# Patient Record
Sex: Female | Born: 1935 | Race: White | Hispanic: No | Marital: Married | Smoking: Never smoker
Health system: Southern US, Community
[De-identification: ages and names within clinical notes are randomized; demographics above are authoritative.]

## PROBLEM LIST (undated history)

## (undated) DIAGNOSIS — F0390 Unspecified dementia without behavioral disturbance: Secondary | ICD-10-CM

## (undated) DIAGNOSIS — S065XAA Traumatic subdural hemorrhage with loss of consciousness status unknown, initial encounter: Secondary | ICD-10-CM

## (undated) DIAGNOSIS — I4891 Unspecified atrial fibrillation: Secondary | ICD-10-CM

## (undated) DIAGNOSIS — I10 Essential (primary) hypertension: Secondary | ICD-10-CM

## (undated) DIAGNOSIS — K219 Gastro-esophageal reflux disease without esophagitis: Secondary | ICD-10-CM

## (undated) DIAGNOSIS — S72001A Fracture of unspecified part of neck of right femur, initial encounter for closed fracture: Secondary | ICD-10-CM

## (undated) DIAGNOSIS — S065X9A Traumatic subdural hemorrhage with loss of consciousness of unspecified duration, initial encounter: Secondary | ICD-10-CM

## (undated) HISTORY — PX: ABDOMINAL HYSTERECTOMY: SHX81

## (undated) HISTORY — PX: HIP SURGERY: SHX245

---

## 2015-07-12 ENCOUNTER — Inpatient Hospital Stay (HOSPITAL_COMMUNITY)
Admission: AD | Admit: 2015-07-12 | Discharge: 2015-07-15 | DRG: 065 | Disposition: A | Discharge status: Skilled Nursing Facility | Payer: Medicare Other | Source: Other Acute Inpatient Hospital | Attending: Internal Medicine | Admitting: Internal Medicine

## 2015-07-12 ENCOUNTER — Encounter (HOSPITAL_COMMUNITY): Payer: Self-pay | Admitting: Internal Medicine

## 2015-07-12 DIAGNOSIS — Z9181 History of falling: Secondary | ICD-10-CM | POA: Diagnosis not present

## 2015-07-12 DIAGNOSIS — Z823 Family history of stroke: Secondary | ICD-10-CM

## 2015-07-12 DIAGNOSIS — F0390 Unspecified dementia without behavioral disturbance: Secondary | ICD-10-CM

## 2015-07-12 DIAGNOSIS — E785 Hyperlipidemia, unspecified: Secondary | ICD-10-CM | POA: Diagnosis not present

## 2015-07-12 DIAGNOSIS — I6340 Cerebral infarction due to embolism of unspecified cerebral artery: Secondary | ICD-10-CM | POA: Diagnosis not present

## 2015-07-12 DIAGNOSIS — Z885 Allergy status to narcotic agent status: Secondary | ICD-10-CM

## 2015-07-12 DIAGNOSIS — K219 Gastro-esophageal reflux disease without esophagitis: Secondary | ICD-10-CM | POA: Insufficient documentation

## 2015-07-12 DIAGNOSIS — Z66 Do not resuscitate: Secondary | ICD-10-CM | POA: Diagnosis present

## 2015-07-12 DIAGNOSIS — Z8249 Family history of ischemic heart disease and other diseases of the circulatory system: Secondary | ICD-10-CM

## 2015-07-12 DIAGNOSIS — G8191 Hemiplegia, unspecified affecting right dominant side: Secondary | ICD-10-CM | POA: Diagnosis not present

## 2015-07-12 DIAGNOSIS — R4701 Aphasia: Secondary | ICD-10-CM | POA: Diagnosis not present

## 2015-07-12 DIAGNOSIS — I69392 Facial weakness following cerebral infarction: Secondary | ICD-10-CM | POA: Diagnosis not present

## 2015-07-12 DIAGNOSIS — I4891 Unspecified atrial fibrillation: Secondary | ICD-10-CM | POA: Diagnosis not present

## 2015-07-12 DIAGNOSIS — I10 Essential (primary) hypertension: Secondary | ICD-10-CM

## 2015-07-12 DIAGNOSIS — I639 Cerebral infarction, unspecified: Secondary | ICD-10-CM | POA: Diagnosis present

## 2015-07-12 DIAGNOSIS — I4892 Unspecified atrial flutter: Secondary | ICD-10-CM | POA: Diagnosis not present

## 2015-07-12 HISTORY — DX: Unspecified dementia, unspecified severity, without behavioral disturbance, psychotic disturbance, mood disturbance, and anxiety: F03.90

## 2015-07-12 HISTORY — DX: Traumatic subdural hemorrhage with loss of consciousness of unspecified duration, initial encounter: S06.5X9A

## 2015-07-12 HISTORY — DX: Fracture of unspecified part of neck of right femur, initial encounter for closed fracture: S72.001A

## 2015-07-12 HISTORY — DX: Essential (primary) hypertension: I10

## 2015-07-12 HISTORY — DX: Gastro-esophageal reflux disease without esophagitis: K21.9

## 2015-07-12 HISTORY — DX: Unspecified atrial fibrillation: I48.91

## 2015-07-12 HISTORY — DX: Traumatic subdural hemorrhage with loss of consciousness status unknown, initial encounter: S06.5XAA

## 2015-07-12 MED ORDER — MAGNESIUM HYDROXIDE 400 MG/5ML PO SUSP: 30.0000 mL | Freq: Every day | ORAL | Status: DC | PRN

## 2015-07-12 MED ORDER — ENOXAPARIN SODIUM 40 MG/0.4ML ~~LOC~~ SOLN
40.0000 mg | Freq: Every day | SUBCUTANEOUS | Status: DC
  Administered 2015-07-13 (×2): 40 mg via SUBCUTANEOUS
  Filled 2015-07-12 (×2): qty 0.4

## 2015-07-12 MED ORDER — SENNOSIDES-DOCUSATE SODIUM 8.6-50 MG PO TABS: 2.0000 | ORAL_TABLET | Freq: Every day | ORAL | Status: DC

## 2015-07-12 MED ORDER — LOSARTAN POTASSIUM 50 MG PO TABS: 100.0000 mg | ORAL_TABLET | Freq: Every day | ORAL | Status: DC

## 2015-07-12 MED ORDER — PANTOPRAZOLE SODIUM 40 MG PO TBEC: 40.0000 mg | DELAYED_RELEASE_TABLET | Freq: Every day | ORAL | Status: DC

## 2015-07-12 MED ORDER — OXYCODONE HCL 5 MG PO TABS: 2.5000 mg | ORAL_TABLET | Freq: Three times a day (TID) | ORAL | Status: DC | PRN

## 2015-07-12 MED ORDER — STROKE: EARLY STAGES OF RECOVERY BOOK
Freq: Once | Status: AC
  Administered 2015-07-13: 02:00:00
  Filled 2015-07-12: qty 1

## 2015-07-12 MED ORDER — MORPHINE SULFATE (PF) 2 MG/ML IV SOLN
0.5000 mg | INTRAVENOUS | Status: DC | PRN
  Administered 2015-07-13: 1 mg via INTRAVENOUS
  Filled 2015-07-12: qty 1

## 2015-07-12 MED ORDER — ASPIRIN 325 MG PO TABS: 325.0000 mg | ORAL_TABLET | Freq: Every day | ORAL | Status: DC

## 2015-07-12 MED ORDER — POLYETHYLENE GLYCOL 3350 17 G PO PACK: 17.0000 g | PACK | ORAL | Status: DC

## 2015-07-12 MED ORDER — MEMANTINE HCL 10 MG PO TABS
10.0000 mg | ORAL_TABLET | Freq: Two times a day (BID) | ORAL | Status: DC
  Filled 2015-07-12: qty 1

## 2015-07-12 MED ORDER — ACETAMINOPHEN 325 MG PO TABS: 650.0000 mg | ORAL_TABLET | Freq: Four times a day (QID) | ORAL | Status: DC | PRN

## 2015-07-12 MED ORDER — ALPRAZOLAM 0.25 MG PO TABS: 0.2500 mg | ORAL_TABLET | Freq: Three times a day (TID) | ORAL | Status: DC | PRN

## 2015-07-12 MED ORDER — METOPROLOL TARTRATE 25 MG PO TABS: 25.0000 mg | ORAL_TABLET | Freq: Two times a day (BID) | ORAL | Status: DC

## 2015-07-12 MED ORDER — PAROXETINE HCL 20 MG PO TABS: 20.0000 mg | ORAL_TABLET | Freq: Every day | ORAL | Status: DC

## 2015-07-12 MED ORDER — HYDROCHLOROTHIAZIDE 25 MG PO TABS: 25.0000 mg | ORAL_TABLET | Freq: Every day | ORAL | Status: DC

## 2015-07-12 NOTE — H&P (Signed)
History and Physical    Emily Franklin ZOX:[id number] DOB: 08/13/1923 DOA: 07/12/2015   PCP: No PCP Per Patient Chief Complaint: No chief complaint on file.   HPI: Emily Franklin is a 82 y.o. female with medical history significant of HTN, dementia, falls occuring this year resulting in SDH in April and a R hip fracture in April.  Patient was in SNF following hip fracture, was actually not doing too well at baseline (with PT could take about 3 steps assisted and that's all).  This morning daughter went to see patient in NH and noticed slurred speech, R sided upper, lower, weakness, facial droop.  Per RN at [facility] patient was normal at breakfast time and ate breakfast, but even that isnt very clear.  What is clear is that it is now 11:30 at night and patient is well out of any interventional window (>12 hours since LKW).  ED Course: Patient went to [facility] ED where the EDP was highly suspicious of stroke.  Got sent to [facility] for neurology consult.  Review of Systems: Positive for R   Past Medical History  Diagnosis Date  . Closed right hip fracture [facility])     Just recently had this occur and surgical repair  . Dementia   . HTN (hypertension)   . A-fib (HCC)   . GERD (gastroesophageal reflux disease)   . SDH (subdural hematoma) [facility]) April    Resolved on most recent CT    Past Surgical History  Procedure Laterality Date  . Abdominal hysterectomy    . Hip surgery      Prior L hip surgery, recent R hip surgery for fracture     reports that she has never smoked. She does not have any smokeless tobacco history on file. She reports that she does not drink alcohol or use illicit drugs.  Allergies  Allergen Reactions  . Codeine     Family History  Problem Relation Age of Onset  . Hypertension Sister   . Hypertension Sister   . Hypertension Mother   . Hypertension Father   . Cancer Brother   . Cancer Brother   . Stroke Maternal Grandmother   . Diabetes Neg Hx   . Heart Problems  Father   . Heart Problems Sister   . Heart Problems Grandchild       Prior to Admission medications   Not on File    Physical Exam: Filed Vitals:   07/12/15 2100 07/12/15 2232  BP: 180/71 180/71  Pulse: 59 65  Temp: 97.7 F (36.5 C) 97.7 F (36.5 C)  TempSrc: Oral Oral  Resp: 18 18  Height:   (1.6 m)  Weight:  64.365 kg (141 lb 14.4 oz)  SpO2: 84% 99%      Constitutional: NAD, calm, complaining of R leg pain Eyes: PERRL, lids and conjunctivae normal ENMT: Mucous membranes are moist. Posterior pharynx clear of any exudate or lesions.Normal dentition.  Neck: normal, supple, no masses, no thyromegaly Respiratory: clear to auscultation bilaterally, no wheezing, no crackles. Normal respiratory effort. No accessory muscle use.  Cardiovascular: Regular rate and rhythm, no murmurs / rubs / gallops. No extremity edema. 2+ pedal pulses. No carotid bruits.  Abdomen: no tenderness, no masses palpated. No hepatosplenomegaly. Bowel sounds positive.  Musculoskeletal: no clubbing / cyanosis. No joint deformity upper and lower extremities. Good ROM, no contractures. Normal muscle tone.  Skin: no rashes, lesions, ulcers. No induration Neurologic: has slurred speech, has dense R sided hemiparesis with at most  1/5 strength in RUE, RLE, has R facial droop Psychiatric: Normal judgment and insight. Alert and oriented x 3. Normal mood.    Labs on Admission: I have personally reviewed following labs and imaging studies  CBC: No results for input(s): WBC, NEUTROABS, HGB, HCT, MCV, PLT in the last 168 hours. Basic Metabolic Panel: No results for input(s): NA, K, CL, CO2, GLUCOSE, BUN, CREATININE, CALCIUM, MG, PHOS in the last 168 hours. GFR: CrCl cannot be calculated (Patient has no serum creatinine result on file.). Liver Function Tests: No results for input(s): AST, ALT, ALKPHOS, BILITOT, PROT, ALBUMIN in the last 168 hours. No results for input(s): LIPASE, AMYLASE in the last 168  hours. No results for input(s): AMMONIA in the last 168 hours. Coagulation Profile: No results for input(s): INR, PROTIME in the last 168 hours. Cardiac Enzymes: No results for input(s): CKTOTAL, CKMB, CKMBINDEX, TROPONINI in the last 168 hours. BNP (last 3 results) No results for input(s): PROBNP in the last 8760 hours. HbA1C: No results for input(s): HGBA1C in the last 72 hours. CBG: No results for input(s): GLUCAP in the last 168 hours. Lipid Profile: No results for input(s): CHOL, HDL, LDLCALC, TRIG, CHOLHDL, LDLDIRECT in the last 72 hours. Thyroid Function Tests: No results for input(s): TSH, T4TOTAL, FREET4, T3FREE, THYROIDAB in the last 72 hours. Anemia Panel: No results for input(s): VITAMINB12, FOLATE, FERRITIN, TIBC, IRON, RETICCTPCT in the last 72 hours. Urine analysis: No results found for: COLORURINE, APPEARANCEUR, LABSPEC, PHURINE, GLUCOSEU, HGBUR, BILIRUBINUR, KETONESUR, PROTEINUR, UROBILINOGEN, NITRITE, LEUKOCYTESUR Sepsis Labs: @LABRCNTIP (procalcitonin:4,lacticidven:4) )No results found for this or any previous visit (from the past 240 hour(s)).   Radiological Exams on Admission: No results found.  EKG: Independently reviewed.  Assessment/Plan Principal Problem:   Acute ischemic stroke (HCC) Active Problems:   HTN (hypertension)   Dementia   Acute ischemic stroke - CT neg at [facility], patient pretty obviously has what appears to be dense L MCA territory infarct on physical exam.  Neuro consult  Stroke pathway  Needs SLP to see if she can eat due to failed sleep study   Holding PO meds tonight, and putting patient on PRN morphine for pain of hip  HTN - continue home meds starting tomorrow after SLP eval  Permissive HTN for tonight and no POs anyhow  Dementia - continue namenda   DVT prophylaxis: Lovenox Code Status: DNR Family Communication: Family at bedside Consults called: Neuro, spoke with Dr. Desmond Lopeurk Admission status: Admit to obs   Emily Franklin,  Emily Franklin [facility] Pager [phone] from 7PM-7AM  If 7AM-7PM, please contact the day physician for the patient [email or web address] Password [email or web address]  07/12/2015, 11:28 PM

## 2015-07-12 NOTE — Progress Notes (Signed)
[facility]. Acute CVA. Notified admitting doctor of her arrival.

## 2015-07-13 ENCOUNTER — Other Ambulatory Visit (HOSPITAL_COMMUNITY): Payer: Medicare Other

## 2015-07-13 ENCOUNTER — Encounter (HOSPITAL_COMMUNITY): Payer: Self-pay | Admitting: Radiology

## 2015-07-13 ENCOUNTER — Observation Stay (HOSPITAL_COMMUNITY): Payer: Medicare Other

## 2015-07-13 ENCOUNTER — Other Ambulatory Visit: Payer: Self-pay

## 2015-07-13 DIAGNOSIS — Z823 Family history of stroke: Secondary | ICD-10-CM | POA: Diagnosis not present

## 2015-07-13 DIAGNOSIS — I639 Cerebral infarction, unspecified: Secondary | ICD-10-CM | POA: Diagnosis not present

## 2015-07-13 DIAGNOSIS — R4701 Aphasia: Secondary | ICD-10-CM | POA: Diagnosis present

## 2015-07-13 DIAGNOSIS — Z8249 Family history of ischemic heart disease and other diseases of the circulatory system: Secondary | ICD-10-CM | POA: Diagnosis not present

## 2015-07-13 DIAGNOSIS — I4892 Unspecified atrial flutter: Secondary | ICD-10-CM | POA: Diagnosis present

## 2015-07-13 DIAGNOSIS — I69392 Facial weakness following cerebral infarction: Secondary | ICD-10-CM | POA: Diagnosis not present

## 2015-07-13 DIAGNOSIS — E785 Hyperlipidemia, unspecified: Secondary | ICD-10-CM

## 2015-07-13 DIAGNOSIS — I6789 Other cerebrovascular disease: Secondary | ICD-10-CM | POA: Diagnosis not present

## 2015-07-13 DIAGNOSIS — Z66 Do not resuscitate: Secondary | ICD-10-CM | POA: Diagnosis present

## 2015-07-13 DIAGNOSIS — Z885 Allergy status to narcotic agent status: Secondary | ICD-10-CM | POA: Diagnosis not present

## 2015-07-13 DIAGNOSIS — I10 Essential (primary) hypertension: Secondary | ICD-10-CM | POA: Diagnosis present

## 2015-07-13 DIAGNOSIS — I4891 Unspecified atrial fibrillation: Secondary | ICD-10-CM | POA: Diagnosis present

## 2015-07-13 DIAGNOSIS — Z9181 History of falling: Secondary | ICD-10-CM | POA: Diagnosis not present

## 2015-07-13 DIAGNOSIS — G8191 Hemiplegia, unspecified affecting right dominant side: Secondary | ICD-10-CM | POA: Diagnosis present

## 2015-07-13 DIAGNOSIS — I480 Paroxysmal atrial fibrillation: Secondary | ICD-10-CM | POA: Diagnosis not present

## 2015-07-13 DIAGNOSIS — K219 Gastro-esophageal reflux disease without esophagitis: Secondary | ICD-10-CM

## 2015-07-13 DIAGNOSIS — F0390 Unspecified dementia without behavioral disturbance: Secondary | ICD-10-CM | POA: Diagnosis present

## 2015-07-13 DIAGNOSIS — I6340 Cerebral infarction due to embolism of unspecified cerebral artery: Secondary | ICD-10-CM | POA: Diagnosis present

## 2015-07-13 LAB — BASIC METABOLIC PANEL
Anion gap: 11 (ref 5–15)
BUN: 11 mg/dL (ref 6–20)
CHLORIDE: 101 mmol/L (ref 101–111)
CO2: 23 mmol/L (ref 22–32)
CREATININE: 0.89 mg/dL (ref 0.44–1.00)
Calcium: 10.2 mg/dL (ref 8.9–10.3)
GFR calc non Af Amer: 56 mL/min — ABNORMAL LOW (ref >60)
Glucose, Bld: 99 mg/dL (ref 65–99)
Potassium: 3.4 mmol/L — ABNORMAL LOW (ref 3.5–5.1)
Sodium: 135 mmol/L (ref 135–145)

## 2015-07-13 LAB — TSH: TSH: 0.682 u[IU]/mL (ref 0.350–4.500)

## 2015-07-13 LAB — CBC
HEMATOCRIT: 35.5 % — AB (ref 36.0–46.0)
HEMOGLOBIN: 11.4 g/dL — AB (ref 12.0–15.0)
MCH: 28.4 pg (ref 26.0–34.0)
MCHC: 32.1 g/dL (ref 30.0–36.0)
MCV: 88.5 fL (ref 78.0–100.0)
Platelets: 267 10*3/uL (ref 150–400)
RBC: 4.01 MIL/uL (ref 3.87–5.11)
RDW: 12.5 % (ref 11.5–15.5)
WBC: 8.7 10*3/uL (ref 4.0–10.5)

## 2015-07-13 LAB — LIPID PANEL
CHOL/HDL RATIO: 6.3 ratio
Cholesterol: 188 mg/dL (ref 0–200)
HDL: 30 mg/dL — AB (ref >40)
LDL Cholesterol: 123 mg/dL — ABNORMAL HIGH (ref 0–99)
TRIGLYCERIDES: 173 mg/dL — AB (ref <150)
VLDL: 35 mg/dL (ref 0–40)

## 2015-07-13 LAB — VITAMIN B12: VITAMIN B 12: 5087 pg/mL — AB (ref 180–914)

## 2015-07-13 LAB — MRSA PCR SCREENING: MRSA by PCR: NEGATIVE

## 2015-07-13 MED ORDER — METOPROLOL TARTRATE 5 MG/5ML IV SOLN
5.0000 mg | Freq: Four times a day (QID) | INTRAVENOUS | Status: DC
  Administered 2015-07-13 – 2015-07-14 (×4): 5 mg via INTRAVENOUS
  Filled 2015-07-13 (×4): qty 5

## 2015-07-13 MED ORDER — SODIUM CHLORIDE 0.9 % IV SOLN
INTRAVENOUS | Status: DC
  Administered 2015-07-13: 1000 mL via INTRAVENOUS
  Administered 2015-07-13: 17:00:00 via INTRAVENOUS

## 2015-07-13 MED ORDER — ASPIRIN 300 MG RE SUPP
300.0000 mg | Freq: Every day | RECTAL | Status: DC
  Administered 2015-07-13 – 2015-07-14 (×2): 300 mg via RECTAL
  Filled 2015-07-13 (×3): qty 1

## 2015-07-13 MED ORDER — ACETAMINOPHEN 650 MG RE SUPP
650.0000 mg | Freq: Four times a day (QID) | RECTAL | Status: DC | PRN
  Administered 2015-07-13: 650 mg via RECTAL
  Filled 2015-07-13: qty 1

## 2015-07-13 MED ORDER — IOPAMIDOL (ISOVUE-370) INJECTION 76%
INTRAVENOUS | Status: AC
  Administered 2015-07-13: 50 mL
  Filled 2015-07-13: qty 50

## 2015-07-13 MED ORDER — LORAZEPAM 2 MG/ML IJ SOLN: 0.2500 mg | Freq: Three times a day (TID) | INTRAMUSCULAR | Status: DC | PRN

## 2015-07-13 MED ORDER — FAMOTIDINE IN NACL 20-0.9 MG/50ML-% IV SOLN
20.0000 mg | INTRAVENOUS | Status: DC
  Administered 2015-07-13: 20 mg via INTRAVENOUS
  Filled 2015-07-13: qty 50

## 2015-07-13 MED ORDER — CETYLPYRIDINIUM CHLORIDE 0.05 % MT LIQD
7.0000 mL | Freq: Two times a day (BID) | OROMUCOSAL | Status: DC
  Administered 2015-07-13 – 2015-07-15 (×3): 7 mL via OROMUCOSAL

## 2015-07-13 NOTE — Evaluation (Signed)
Occupational Therapy Evaluation Patient Details Name: Emily Franklin MRN: [id number] DOB: 02-22-1923 Today's Date: 07/13/2015    History of Present Illness Patient is a 82 y/o female with hx of dementia, HTN, A-fib, SDH and recent hip fx presents from SNF with slurred speech, R sided upper, weakness and facial droop.    Clinical Impression   Pt was walking minimally, able to self feed and otherwise dependent in ADL prior to admission. She is a long term resident of [facility] in [place]. Pt's sister was present for evaluation. Pt requiring +2 max assist for bed level mobility. She demonstrates dense R hemiplegia and inattention. Difficult to assess vision due to impaired cognition and pt closing eyes frequently throughout session. Pt will need SNF level rehab upon discharge. Will follow.    Follow Up Recommendations  SNF;Supervision/Assistance - 24 hour    Equipment Recommendations       Recommendations for Other Services       Precautions / Restrictions Precautions Precautions: Fall      Mobility Bed Mobility Overal bed mobility: Needs Assistance Bed Mobility: Supine to Sit;Sit to Supine     Supine to sit: Max assist;+2 for physical assistance Sit to supine: +2 for physical assistance;Max assist   General bed mobility comments: initiated moving to EOB with L UE and LE, assist for trunk and R side, used bed pad to scoot hips  Transfers      General transfer comment: did not attempt this visit    Balance Overall balance assessment: Needs assistance;History of Falls Sitting-balance support: Feet supported;Single extremity supported Sitting balance-Leahy Scale: Poor Sitting balance - Comments: requires UE assist to balance at EOB                                 ADL                                         General ADL Comments: Pt is currently NPO pending swallowing evaluation. Total assist for ADL.     Vision Additional Comments:  glasses are not available, pt not able to comply to requirements of formal testing, turning head to R with cues to locate visual targets on R side, R side inattention   Perception     Praxis      Pertinent Vitals/Pain Pain Assessment: Faces Faces Pain Scale: Hurts little more Pain Location: R LE with repositioning Pain Descriptors / Indicators: Moaning Pain Intervention(s): Monitored during session;Repositioned     Hand Dominance Right   Extremity/Trunk Assessment Upper Extremity Assessment Upper Extremity Assessment: RUE deficits/detail RUE Deficits / Details: intact response to temperature, full PROM, no active movement observed RUE Sensation: decreased proprioception;decreased light touch (difficult to assess due to impaired cognition) RUE Coordination: decreased fine motor;decreased gross motor   Lower Extremity Assessment Lower Extremity Assessment: Defer to PT evaluation    Cervical / Trunk Assessment Cervical / Trunk Assessment: Kyphotic   Communication Communication Communication: Expressive difficulties (dysarthric)   Cognition Arousal/Alertness: Lethargic Behavior During Therapy: WFL for tasks assessed/performed Overall Cognitive Status: History of cognitive impairments - at baseline (pt with hx of dementia)                     General Comments       Exercises       Shoulder Instructions  Home Living Family/patient expects to be discharged to:: Skilled nursing facility                                        Prior Functioning/Environment Level of Independence: Needs assistance  Gait / Transfers Assistance Needed: per sister, pt walked only a few feet since her hip fx in April ADL's / Homemaking Assistance Needed: Pt could self feed. Assisted for bathing, dressing and toileting.   Comments: Information gathered from sister.    OT Diagnosis: Generalized weakness;Acute pain;Cognitive deficits;Disturbance of vision;Hemiplegia  dominant side   OT Problem List: Decreased strength;Decreased activity tolerance;Impaired balance (sitting and/or standing);Impaired vision/perception;Decreased coordination;Decreased cognition;Decreased safety awareness;Decreased knowledge of use of DME or AE;Impaired tone;Impaired UE functional use;Pain;Impaired sensation   OT Treatment/Interventions: Self-care/ADL training;Neuromuscular education;Therapeutic activities;Visual/perceptual remediation/compensation;Patient/family education;Balance training    OT Goals(Current goals can be found in the care plan section) Acute Rehab OT Goals Patient Stated Goal: sister--return to [facility] [place] OT Goal Formulation: With family Time For Goal Achievement: 07/27/15 Potential to Achieve Goals: Fair ADL Goals Pt Will Perform Eating: with mod assist;sitting;bed level (with L hand) Pt Will Perform Grooming: sitting;bed level;with mod assist (with L hand) Additional ADL Goal #1: Pt will locate visual targets on L side with minimal verbal cues 50% of trials. Additional ADL Goal #2: Pt will sit EOB x 10 minutes with min guard assist in preparation for ADL at EOB.  OT Frequency: Min 2X/week   Barriers to D/C:            Co-evaluation              End of Session    Activity Tolerance: Patient limited by fatigue Patient left: in bed;with call bell/phone within reach;with bed alarm set;with family/visitor present   Time: 1533-1601 OT Time Calculation (min): 28 min Charges:  OT General Charges $OT Visit: 1 Procedure OT Evaluation $OT Eval Moderate Complexity: 1 Procedure OT Treatments $Therapeutic Activity: 8-22 mins G-Codes: OT G-codes **NOT FOR INPATIENT CLASS** Functional Assessment Tool Used: clinical judgement Functional Limitation: Self care Self Care Current Status (E4540(G8987): 100 percent impaired, limited or restricted Self Care Goal Status (J8119(G8988): At least 80 percent but less than 100 percent impaired, limited or restricted   Evern BioMayberry, Keaston Pile Lynn 07/13/2015, 4:27 PM [phone]

## 2015-07-13 NOTE — Evaluation (Signed)
Clinical/Bedside Swallow Evaluation Patient Details  Name: Emily Franklin MRN: [id number] Date of Birth: March 13, 1923  Today's Date: 07/13/2015 Time: SLP Start Time (ACUTE ONLY): 1606 SLP Stop Time (ACUTE ONLY): 1620 SLP Time Calculation (min) (ACUTE ONLY): 14 min  Past Medical History:  Past Medical History  Diagnosis Date  . Closed right hip fracture [facility](HCC)     Just recently had this occur and surgical repair  . Dementia   . HTN (hypertension)   . A-fib (HCC)   . GERD (gastroesophageal reflux disease)   . SDH (subdural hematoma) [facility](HCC) April    Resolved on most recent CT   Past Surgical History:  Past Surgical History  Procedure Laterality Date  . Abdominal hysterectomy    . Hip surgery      Prior L hip surgery, recent R hip surgery for fracture   HPI:  Patient is a 82 y/o female with hx of dementia, HTN, A-fib, SDH and recent hip fx presents from SNF with slurred speech, R sided upper, weakness and facial droop. MRI still pending.   Assessment / Plan / Recommendation Clinical Impression  Patient presents with severe oral motor deficits characterized by left-sided oral weakness that results in anterior loss of boluses. Patient's cognition appeared to impact her ability to attend to boluses.  Patient also with inability to trigger swallows resulting in immediate and delayed coughs after presentation of ice chip and water boluses.  Given severity of deficits and inability to determine safety at bedside recommend an objective swallow assessment prior to diet initiation.      Aspiration Risk  Severe aspiration risk    Diet Recommendation NPO;Alternative means - temporary   Medication Administration: Via alternative means         Follow up Recommendations  Defer until after objective assessment; MBS scheduled for 6/20                       Swallow Study   General HPI: Patient is a 82 y/o female with hx of dementia, HTN, A-fib, SDH and recent hip fx presents from SNF  with slurred speech, R sided upper, weakness and facial droop. MRI still pending. Type of Study: Bedside Swallow Evaluation Previous Swallow Assessment: none on record Diet Prior to this Study: NPO Temperature Spikes Noted: No Respiratory Status: Room air History of Recent Intubation: No Behavior/Cognition: Alert;Cooperative;Pleasant mood;Requires cueing;Confused Oral Cavity Assessment: Within Functional Limits Oral Care Completed by SLP: Recent completion by staff Vision: Functional for self-feeding Self-Feeding Abilities: Needs assist;Needs set up Patient Positioning: Upright in bed Baseline Vocal Quality: Normal Volitional Cough: Cognitively unable to elicit Volitional Swallow: Unable to elicit    Oral/Motor/Sensory Function Overall Oral Motor/Sensory Function: Severe impairment Facial ROM: Suspected CN VII (facial) dysfunction;Reduced left Facial Symmetry: Suspected CN VII (facial) dysfunction;Abnormal symmetry left Facial Strength: Suspected CN VII (facial) dysfunction;Reduced left Lingual ROM: Reduced right;Reduced left;Suspected CN XII (hypoglossal) dysfunction Lingual Symmetry: Abnormal symmetry left Lingual Strength: Suspected CN XII (hypoglossal) dysfunction;Reduced   Ice Chips Ice chips: Impaired Presentation: Spoon;Self Fed Oral Phase Impairments: Reduced lingual movement/coordination;Poor awareness of bolus Oral Phase Functional Implications: Left anterior spillage Pharyngeal Phase Impairments: Unable to trigger swallow;Cough - Immediate;Cough - Delayed   Thin Liquid Thin Liquid: Impaired Presentation: Cup Oral Phase Impairments: Reduced labial seal;Reduced lingual movement/coordination;Poor awareness of bolus Oral Phase Functional Implications: Left anterior spillage Pharyngeal  Phase Impairments: Unable to trigger swallow    Nectar Thick Nectar Thick Liquid: Not tested   Honey Thick Honey Thick  Liquid: Not tested   Puree Puree: Not tested   Solid   GO   Solid:  Not tested    Functional Assessment Tool Used: skilled clinical judgement  Functional Limitations: Swallowing Swallow Current Status (W0981): At least 80 percent but less than 100 percent impaired, limited or restricted Swallow Goal Status [phone]): At least 20 percent but less than 40 percent impaired, limited or restricted   Emily Franklin., CCC-SLP [phone]  Emily Franklin 07/13/2015,4:47 PM

## 2015-07-13 NOTE — Evaluation (Signed)
Physical Therapy Evaluation Patient Details Name: Emily Franklin MRN: [id number] DOB: 06/20/1923 Today's Date: 07/13/2015   History of Present Illness  Patient is a 82 y/o female with hx of dementia, HTN, A-fib, SDH and recent hip fx presents from SNF with slurred speech, R sided upper, weakness and facial droop.   Clinical Impression  Patient presents with Right hemiplegia, dysarthria, increased tone RUE/LE, hx of cognitive deficits (dementia) and impaired mobility s/p above. Pt is a long term care resident of [facility] nursing facility. Not sure of PLOF however per MD notes, pt only taking a few steps assisted prior to hip fx resulting in placement in nursing home. Pt now requires total A of 2 to transfer to chair. Recommend maxi move for OOB. Pt appropriate to return to SNF once medically stable and will need continued rehab services to maximize mobility and ease burden of care. Will follow.    Follow Up Recommendations SNF    Equipment Recommendations  None recommended by PT    Recommendations for Other Services OT consult     Precautions / Restrictions Precautions Precautions: Fall      Mobility  Bed Mobility Overal bed mobility: Needs Assistance Bed Mobility: Supine to Sit     Supine to sit: Max assist;+2 for physical assistance     General bed mobility comments: Assist of 2 to get to EOB. Able to initiate movement with LLE/LUE. Assist to move RLE, with trunk and to scoot bottom to EOB.  Transfers Overall transfer level: Needs assistance   Transfers: Squat Pivot Transfers     Squat pivot transfers: Total assist;+2 physical assistance     General transfer comment: Assist of 2 to squat pivot transfer to chair towards left side. No functional movement of RLE.  Ambulation/Gait                Stairs            Wheelchair Mobility    Modified Rankin (Stroke Patients Only) Modified Rankin (Stroke Patients Only) Pre-Morbid Rankin Score: Severe  disability (not sure if this is accurate) Modified Rankin: Severe disability     Balance Overall balance assessment: Needs assistance;History of Falls Sitting-balance support: Feet supported;Single extremity supported Sitting balance-Leahy Scale: Fair Sitting balance - Comments: Able to sit EOB with UE support.    Standing balance support: During functional activity Standing balance-Leahy Scale: Zero                               Pertinent Vitals/Pain Pain Assessment: Faces Faces Pain Scale: Hurts whole lot Pain Location: RLE/UE with movement Pain Descriptors / Indicators: Grimacing Pain Intervention(s): Monitored during session    Home Living Family/patient expects to be discharged to:: Skilled nursing facility                      Prior Function Level of Independence: Needs assistance      ADL's / Homemaking Assistance Needed: Most likely needs assist with all care.  Comments: Pt not able to provide much PLOF/history however noted "Why can't i walk?" Per notes, pt is [facility] at [facility].     Hand Dominance        Extremity/Trunk Assessment   Upper Extremity Assessment: Defer to OT evaluation (Tone present in RUE however difficult to assess; weak grip, able to supinate but 1/5 shoulder elevation.)  Lower Extremity Assessment: RLE deficits/detail RLE Deficits / Details: Tone present in RLE- quads/hamstrings. pain with movement so difficult to assess.    Cervical / Trunk Assessment: Kyphotic  Communication   Communication: Expressive difficulties (dysarthric)  Cognition Arousal/Alertness: Awake/alert Behavior During Therapy: WFL for tasks assessed/performed Overall Cognitive Status: Difficult to assess (hx of dementia.)                      General Comments      Exercises        Assessment/Plan    PT Assessment Patient needs continued PT services  PT Diagnosis Altered mental  status;Hemiplegia dominant side;Generalized weakness   PT Problem List Decreased strength;Pain;Decreased range of motion;Decreased cognition;Decreased balance;Decreased mobility;Impaired tone  PT Treatment Interventions Functional mobility training;Therapeutic activities;Therapeutic exercise;Patient/family education;Wheelchair mobility training;Neuromuscular re-education;Gait training;Balance training   PT Goals (Current goals can be found in the Care Plan section) Acute Rehab PT Goals Patient Stated Goal: "why can't I walk?" PT Goal Formulation: With patient Time For Goal Achievement: 07/27/15 Potential to Achieve Goals: Fair    Frequency Min 2X/week   Barriers to discharge        Co-evaluation               End of Session Equipment Utilized During Treatment: Gait belt Activity Tolerance: Patient tolerated treatment well Patient left: in chair;with call bell/phone within reach;with chair alarm set Nurse Communication: Mobility status;Need for lift equipment (maxi move recommended- wrote on white board)    Functional Assessment Tool Used: clinical judgment Functional Limitation: Mobility: Walking and moving around Mobility: Walking and Moving Around Current Status (Z6109): 100 percent impaired, limited or restricted Mobility: Walking and Moving Around Goal Status (U0454): At least 80 percent but less than 100 percent impaired, limited or restricted    Time: 1150-1209 PT Time Calculation (min) (ACUTE ONLY): 19 min   Charges:   PT Evaluation $PT Eval Moderate Complexity: 1 Procedure     PT G Codes:   PT G-Codes **NOT FOR INPATIENT CLASS** Functional Assessment Tool Used: clinical judgment Functional Limitation: Mobility: Walking and moving around Mobility: Walking and Moving Around Current Status (U9811): 100 percent impaired, limited or restricted Mobility: Walking and Moving Around Goal Status (B1478): At least 80 percent but less than 100 percent impaired, limited or  restricted    Devontae Casasola A Lynnex Fulp 07/13/2015, 1:28 PM Mylo Red, PT, DPT [phone]

## 2015-07-13 NOTE — Progress Notes (Signed)
[facility] PROGRESS NOTE  Emily Franklin ZOX:[id number] DOB: 10-23-1923 DOA: 07/12/2015 PCP: No PCP Per Patient  Interim summary and HPI 82 y.o. female with medical history significant of HTN, dementia, falls occuring this year resulting in SDH in April and a R hip fracture in April. Patient was in SNF following hip fracture, was actually not doing too well at baseline (with PT could take about 3 steps assisted and that's all). This morning daughter went to see patient in NH and noticed slurred speech, R sided upper, lower, weakness, facial droop. Per RN at [facility] patient was normal at breakfast time and ate breakfast, but even that isnt very clear. What is clear is that it is now 11:30 at night and patient is well out of any interventional window (>12 hours since LKW).  Complete stroke work up. Will need SNF at discharge   Assessment/Plan: 1-stroke like symptoms: likely left brain infarct; with residual right facial droop and right hemiparesis (affecting arm > leg) -will continue aspirin rectally, while NPO -complete stroke work up -patient found to be on A. Fib (newly found); this could be reason for stroke. Will assess candidacy for anticoagulation -LDL 123; will need statins once able to take PO's -will follow rec's from neurology service  -continue PT/OT and SPL  2-A. Fib -new diagnosis -will continue telemetry -2-D echo pending   3-HLD -statins when able to take PO -LDL 123  4-HTN: -stable -will be permissive with hypertension currently  5-Dementia: -continue supportive care  6-GERD: -continue home PPI when able to take PO's -will use pepcid IV now    DVT prophylaxis: lovenox Code Status: DNR Family Communication: no family at bedside  Disposition Plan: will need SNF at discharge base on recommendations from PT/OT   Consultants:  Neurology   Procedures:  MRI pending  CTA head and neck: diffuse mild atherosclerosis and no signs of high stenosis   2-D  echo pending  Antibiotics:  None   HPI/Subjective: Demented/oriented X 1 only. No acute distress or complaints. With positive right facial droop and right hemiparesis.  Objective: Filed Vitals:   07/13/15 1613 07/13/15 1839  BP: 152/92 139/81  Pulse: 97 91  Temp: 98.5 F (36.9 C) 98.2 F (36.8 C)  Resp: 20 20    Intake/Output Summary (Last 24 hours) at 07/13/15 1900 Last data filed at 07/13/15 1440  Gross per 24 hour  Intake      0 ml  Output      0 ml  Net      0 ml   Filed Weights   07/12/15 2232  Weight: 64.365 kg (141 lb 14.4 oz)    Exam:   General:  Pleasantly confused and oriented only to person. Able to follow simple commands intermittently, but require significant amount of cueing.  Cardiovascular: irregular, no rubs or gallops, no JVD  Respiratory: CTA bilaterally   Abdomen: soft, NT, ND, positive BS  Musculoskeletal: no cyanosis or edema  Neurology exam: positive right facial droop, dense right side hemiparesis and positive slurred speech (even improved some from description on admission)  Data Reviewed: Basic Metabolic Panel:  Recent Labs Lab 07/13/15 0658  NA 135  K 3.4*  CL 101  CO2 23  GLUCOSE 99  BUN 11  CREATININE 0.89  CALCIUM 10.2   CBC:  Recent Labs Lab 07/13/15 0658  WBC 8.7  HGB 11.4*  HCT 35.5*  MCV 88.5  PLT 267   CBG: No results for input(s): GLUCAP in the last 168  hours.  Recent Results (from the past 240 hour(s))  MRSA PCR Screening     Status: None   Collection Time: 07/13/15  5:22 AM  Result Value Ref Range Status   MRSA by PCR NEGATIVE NEGATIVE Final    Comment:        The GeneXpert MRSA Assay (FDA approved for NASAL specimens only), is one component of a comprehensive MRSA colonization surveillance program. It is not intended to diagnose MRSA infection nor to guide or monitor treatment for MRSA infections.      Studies: Ct Angio Head W Or Wo Contrast  07/13/2015  CLINICAL DATA:  82 year old  hypertensive female with history of subdural hematoma and dementia presenting with dense right hemiparesis and aphasia. Subsequent encounter. EXAM: CT ANGIOGRAPHY HEAD AND NECK TECHNIQUE: Multidetector CT imaging of the head and neck was performed using the standard protocol during bolus administration of intravenous contrast. Multiplanar CT image reconstructions and MIPs were obtained to evaluate the vascular anatomy. Carotid stenosis measurements (when applicable) are obtained utilizing NASCET criteria, using the distal internal carotid diameter as the denominator. CONTRAST:  50 cc Isovue 370. COMPARISON:  07/12/2015 and 05/20/2015 head CT. FINDINGS: CT HEAD Brain: No intracranial hemorrhage. Significant white matter changes limited evaluation for detection of acute infarct. No large acute infarct detected. No intracranial enhancing lesion. Global atrophy without hydrocephalus. Calvarium and skull base: Negative. Paranasal sinuses: Opacification diminutive size right sphenoid sinus. Orbits: Post lens replacement.  Probable calcification bilaterally. CTA NECK Aortic arch: Ectatic with atherosclerotic changes. Right carotid system: Mild irregularity and narrowing without high-grade stenosis involving the right common carotid artery or right internal carotid artery. Moderate narrowing origin right external carotid artery. Left carotid system: Irregularity with slight narrowing left common carotid artery and internal carotid artery without significant narrowing. Vertebral arteries:Slight irregularity without high-grade stenosis. Skeleton: Degenerative changes cervical spine most prominent C5-6. Scattered Schmorl's node deformities with mild loss of height superior endplate T2 on T3. Scoliosis cervical and thoracic spine. Other neck: Heterogeneous thyroid gland with largest nodule right lobe measuring up to 2 cm. This can be assessed/ followed with thyroid ultrasound. No worrisome lung apical mass. CTA HEAD Anterior  circulation: Calcified cavernous segment internal carotid artery with mild narrowing bilaterally. Right internal carotid artery supraclinoid segment calcification with mild moderate narrowing. Mild narrowing and irregularity M1 segment middle cerebral artery bilaterally. M2/M3 branch vessel moderate narrowing bilaterally. Moderate narrowing A1 segment and A2 segment left anterior cerebral artery with mild narrowing right anterior cerebral artery A1 segment. Posterior circulation: Mild narrowing distal vertebral arteries bilaterally. Markedly ectatic basilar artery. Marked tandem stenosis posterior cerebral artery bilaterally involving proximal mid and distal aspect bilaterally. Venous sinuses: Patent. Anatomic variants: Negative Delayed phase: As above. IMPRESSION: CT HEAD No intracranial hemorrhage. Significant white matter changes limits evaluation for detection of acute infarct. No large acute infarct detected. No intracranial enhancing lesion. Global atrophy without hydrocephalus. CTA NECK Mild irregularity and narrowing without high-grade stenosis involving either common carotid artery, either internal carotid artery or either vertebral artery. Degenerative changes cervical spine most prominent C5-6. Scattered Schmorl's node deformities with mild loss of height superior endplate T2 on T3. Scoliosis cervical and thoracic spine. Heterogeneous thyroid gland with largest nodule right lobe measuring up to 2 cm. This can be assessed/ followed with thyroid ultrasound. CTA HEAD Calcified cavernous segment internal carotid artery with mild narrowing bilaterally. Right internal carotid artery supraclinoid segment calcification with mild to moderate narrowing. Mild narrowing and irregularity M1 segment middle cerebral artery bilaterally. M2/M3 branch  vessel moderate narrowing bilaterally. Moderate narrowing A1 segment and A2 segment left anterior cerebral artery with mild narrowing right anterior cerebral artery A1  segment. Mild narrowing distal vertebral arteries bilaterally. Markedly ectatic basilar artery. Marked tandem stenosis posterior cerebral artery bilaterally involving proximal mid and distal aspect bilaterally. Electronically Signed   By: Lacy DuverneySteven  Olson M.D.   On: 07/13/2015 11:35   Dg Chest 2 View  07/13/2015  CLINICAL DATA:  Stroke EXAM: CHEST  2 VIEW COMPARISON:  05/15/2015 FINDINGS: Stable widened appearance of mediastinum much of which is due to vascular tortuosity. The patient is rotated to the right on today's radiograph, reducing diagnostic sensitivity and specificity. Bony demineralization noted. Blunting of the left lateral costophrenic angle. Mild enlargement of the cardiopericardial silhouette. Several compression fractures are noted in the thoracic spine, 1 in the mid to lower thoracic spine on the lateral projection with about 50% loss of height. There is also a mid lumbar compression fracture. Deformity of the left proximal humerus noted, healed. IMPRESSION: 1. Prominent mediastinum ascribed to tortuous vasculature. Mild enlargement of the cardiopericardial silhouette. 2. Bony demineralization with several thoracic and lumbar compression fractures, which may well be old. 3. Trace left pleural effusion, with blunting of the left lateral costophrenic angle. Electronically Signed   By: Gaylyn RongWalter  Liebkemann M.D.   On: 07/13/2015 18:38   Ct Angio Neck W Or Wo Contrast  07/13/2015  CLINICAL DATA:  82 year old hypertensive female with history of subdural hematoma and dementia presenting with dense right hemiparesis and aphasia. Subsequent encounter. EXAM: CT ANGIOGRAPHY HEAD AND NECK TECHNIQUE: Multidetector CT imaging of the head and neck was performed using the standard protocol during bolus administration of intravenous contrast. Multiplanar CT image reconstructions and MIPs were obtained to evaluate the vascular anatomy. Carotid stenosis measurements (when applicable) are obtained utilizing NASCET  criteria, using the distal internal carotid diameter as the denominator. CONTRAST:  50 cc Isovue 370. COMPARISON:  07/12/2015 and 05/20/2015 head CT. FINDINGS: CT HEAD Brain: No intracranial hemorrhage. Significant white matter changes limited evaluation for detection of acute infarct. No large acute infarct detected. No intracranial enhancing lesion. Global atrophy without hydrocephalus. Calvarium and skull base: Negative. Paranasal sinuses: Opacification diminutive size right sphenoid sinus. Orbits: Post lens replacement.  Probable calcification bilaterally. CTA NECK Aortic arch: Ectatic with atherosclerotic changes. Right carotid system: Mild irregularity and narrowing without high-grade stenosis involving the right common carotid artery or right internal carotid artery. Moderate narrowing origin right external carotid artery. Left carotid system: Irregularity with slight narrowing left common carotid artery and internal carotid artery without significant narrowing. Vertebral arteries:Slight irregularity without high-grade stenosis. Skeleton: Degenerative changes cervical spine most prominent C5-6. Scattered Schmorl's node deformities with mild loss of height superior endplate T2 on T3. Scoliosis cervical and thoracic spine. Other neck: Heterogeneous thyroid gland with largest nodule right lobe measuring up to 2 cm. This can be assessed/ followed with thyroid ultrasound. No worrisome lung apical mass. CTA HEAD Anterior circulation: Calcified cavernous segment internal carotid artery with mild narrowing bilaterally. Right internal carotid artery supraclinoid segment calcification with mild moderate narrowing. Mild narrowing and irregularity M1 segment middle cerebral artery bilaterally. M2/M3 branch vessel moderate narrowing bilaterally. Moderate narrowing A1 segment and A2 segment left anterior cerebral artery with mild narrowing right anterior cerebral artery A1 segment. Posterior circulation: Mild narrowing  distal vertebral arteries bilaterally. Markedly ectatic basilar artery. Marked tandem stenosis posterior cerebral artery bilaterally involving proximal mid and distal aspect bilaterally. Venous sinuses: Patent. Anatomic variants: Negative Delayed phase: As above.  IMPRESSION: CT HEAD No intracranial hemorrhage. Significant white matter changes limits evaluation for detection of acute infarct. No large acute infarct detected. No intracranial enhancing lesion. Global atrophy without hydrocephalus. CTA NECK Mild irregularity and narrowing without high-grade stenosis involving either common carotid artery, either internal carotid artery or either vertebral artery. Degenerative changes cervical spine most prominent C5-6. Scattered Schmorl's node deformities with mild loss of height superior endplate T2 on T3. Scoliosis cervical and thoracic spine. Heterogeneous thyroid gland with largest nodule right lobe measuring up to 2 cm. This can be assessed/ followed with thyroid ultrasound. CTA HEAD Calcified cavernous segment internal carotid artery with mild narrowing bilaterally. Right internal carotid artery supraclinoid segment calcification with mild to moderate narrowing. Mild narrowing and irregularity M1 segment middle cerebral artery bilaterally. M2/M3 branch vessel moderate narrowing bilaterally. Moderate narrowing A1 segment and A2 segment left anterior cerebral artery with mild narrowing right anterior cerebral artery A1 segment. Mild narrowing distal vertebral arteries bilaterally. Markedly ectatic basilar artery. Marked tandem stenosis posterior cerebral artery bilaterally involving proximal mid and distal aspect bilaterally. Electronically Signed   By: Lacy Duverney M.D.   On: 07/13/2015 11:35    Scheduled Meds: . antiseptic oral rinse  7 mL Mouth Rinse BID  . aspirin  300 mg Rectal Daily  . enoxaparin (LOVENOX) injection  40 mg Subcutaneous QHS  . memantine  10 mg Oral BID  . metoprolol  5 mg Intravenous  Q6H  . pantoprazole  40 mg Oral Daily  . PARoxetine  20 mg Oral Daily  . polyethylene glycol  17 g Oral QODAY  . senna-docusate  2 tablet Oral QHS   Continuous Infusions: . sodium chloride 50 mL/hr at 07/13/15 1709    Principal Problem:   Acute ischemic stroke [facility]) Active Problems:   HTN (hypertension)   Dementia    Time spent: 30 minutes    Vassie Loll  [facility] Pager [phone]. If 7PM-7AM, please contact night-coverage at [email or web address], password [facility] 07/13/2015, 7:00 PM

## 2015-07-13 NOTE — Consult Note (Addendum)
Neurology Consultation Reason for Consult: Stroke  HPI:  Emily Franklin is a 82 y.o. female with history of HTN, dementia, falls occuring this year resulting in SDH in April resulting in R hip fracture s/p surgery.  Patient is a resident of a skilled nursing facility due to recent hip fracture. Last seen normal time is unclear earlier today was found to have right-sided hemiparesis and aphasia. Patient has poor baseline due to underlying dementia Patient was sent to [facility] ED where she was initially evaluated and subsequently was transferred to our facility for further management. Per their report CT scan of the head did not show any acute abnormality. Clinically the patient seen in encephalopathic has dense right-sided hemiparesis and significant aphasia   Past Medical History  Diagnosis Date  . Closed right hip fracture [facility])     Just recently had this occur and surgical repair  . Dementia   . HTN (hypertension)   . A-fib (HCC)   . GERD (gastroesophageal reflux disease)   . SDH (subdural hematoma) [facility]) April    Resolved on most recent CT    Past Surgical History  Procedure Laterality Date  . Abdominal hysterectomy    . Hip surgery      Prior L hip surgery, recent R hip surgery for fracture    Social Hx No hx of smoking drug or alcohol abuse  Allergies  Allergen Reactions  . Codeine     meds prior to admission  Tylenol 650 PRN Xanax 0.25 PRN Aspirin 325 daily Lovenox 40 qhs HCTZ  daily Losartan  daily Milk of mg PRN namenda  bid Lopressor  bid Morphine PRN oxycodon PRN protonix  daily paxil  daily miralax 17g daily Senokot 2 tab qhs   Family History  Problem Relation Age of Onset  . Hypertension Sister   . Hypertension Sister   . Hypertension Mother   . Hypertension Father   . Cancer Brother   . Cancer Brother   . Stroke Maternal Grandmother    . Diabetes Neg Hx   . Heart Problems Father   . Heart Problems Sister   . Heart Problems Grandchild         Review of systems Unable to obtain due to patient's mental status  Exam: Current vital signs: BP 180/71 mmHg  Pulse 65  Temp(Src) 97.7 F (36.5 C) (Oral)  Resp 18  Ht  (1.6 m)  Wt 64.365 kg (141 lb 14.4 oz)  BMI 25.14 kg/m2  SpO2 99% Vital signs in last 24 hours: Temp:  [97.7 F (36.5 C)] 97.7 F (36.5 C) (06/18 2232) Pulse Rate:  [59-65] 65 (06/18 2232) Resp:  [18] 18 (06/18 2232) BP: (180)/(71) 180/71 mmHg (06/18 2232) SpO2:  [84 %-99 %] 99 % (06/18 2232) Weight:  [64.365 kg (141 lb 14.4 oz)] 64.365 kg (141 lb 14.4 oz) (06/18 2232)  Physical Exam  [facility] S1-S2 regular Lungs clear to auscultation Psychiatric encephalopathic Abdomen nontender Lying in bed eyes closed, mumbles few words with voice command but does not follow instructions. Pupils are reactive bilaterally, left-sided neglect, left facial droop Spontaneous movement on the left side. Dense right-sided hemiparesis. Encephalopathic   Assessment and recommendations This is a 82 year old female with history of hypertension, dementia, recent history of falls resulting in subdural hematoma and left hip fracture status post surgery resident of skilled nursing facility was found to have dense right-sided hemiparesis. Onset of symptoms unclear but probably happened earlier today. Patient clinical history and exam is consistent with  left MCA stroke Diagnoses and long-term prognosis which is probably poor was discussed in detail with the family Recommend to obtain MRI brain and MRA head and neck. EKG and echocardiogram and lipid panel and hemoglobin A1c. Speech consultation for swallow eval. Rectal aspirin   Cy BlamerZulfiqar Ravin Bendall, MD 12:19 AM

## 2015-07-13 NOTE — Progress Notes (Signed)
SLP Cancellation Note  Patient Details Name: [name] MRN: [id number] DOB: 03/11/1923   Cancelled treatment:       Reason Eval/Treat Not Completed: Patient at procedure or test/unavailable.  SLP will follow up as able.  Fae PippinMelissa Solimar Maiden, M.A., CCC-SLP [phone]  Janyah Singleterry 07/13/2015, 10:35 AM

## 2015-07-13 NOTE — Progress Notes (Signed)
Pt arrived to 5M08 via bed.  Pt alert and disoriented.  Sister at bedside.  Telemetry applied and CCMD notified.  Will continue to monitor.  Sondra ComeSilva, Legrand Lasser M, RN

## 2015-07-13 NOTE — Progress Notes (Signed)
Initial Nutrition Assessment  DOCUMENTATION CODES:   Not applicable  INTERVENTION:   -RD will follow for diet advancement and supplement as appropriate  NUTRITION DIAGNOSIS:   Inadequate oral intake related to inability to eat as evidenced by NPO status.  GOAL:   Patient will meet greater than or equal to 90% of their needs  MONITOR:   Diet advancement, Labs, Weight trends, Skin, I & O's  REASON FOR ASSESSMENT:   Low Braden    ASSESSMENT:   Emily Franklin is a 82 y.o. female with medical history significant of HTN, dementia, falls occuring this year resulting in SDH in April and a R hip fracture in April. Patient was in SNF following hip fracture, was actually not doing too well at baseline (with PT could take about 3 steps assisted and that's all). This morning daughter went to see patient in NH and noticed slurred speech, R sided upper, lower, weakness, facial droop  Pt admitted with acute ischemic stroke.   Pt lying in bed at time of visit. She interacted with this RD prior to visit, but answered most questions with "I don't know". Pt unable to provide hx due to cognitive deficit and no family members present at time of visit.   Pt is a resident of [facility]. Reviewed records from SNF; pt was receiving a regular, NAS diet PTA. Pt received MVI and calcium with vitamin D supplements. No nutritional supplements indicated per MAR.   No wt hx available to assess weight changes at this time.   Nutrition-Focused physical exam completed. Findings are mild fat depletion, mild muscle depletion, and no edema. Suspect mild fat and muscle depletion is due to advanced age and decline in mobility.   SLP eval pending. Pt is currently NPO. Noted that pt failed RN swallow screen.  Case discussed with RN.   Labs reviewed: K: 3.3.   Diet Order:  Diet NPO time specified  Skin:  Reviewed, no issues  Last BM:  PTA  Height:   Ht Readings from Last 1 Encounters:  07/12/15 5\' 3"   (1.6 m)    Weight:   Wt Readings from Last 1 Encounters:  07/12/15 141 lb 14.4 oz (64.365 kg)    Ideal Body Weight:  52.3 kg  BMI:  Body mass index is 25.14 kg/(m^2).  Estimated Nutritional Needs:   Kcal:  1400-1600  Protein:  60-70 grams  Fluid:  1.4-1.6 L  EDUCATION NEEDS:   No education needs identified at this time  Tenicia Gural A. Mayford KnifeWilliams, RD, LDN, CDE Pager: [phone] After hours Pager: [phone]

## 2015-07-13 NOTE — Progress Notes (Signed)
STROKE TEAM PROGRESS NOTE   HISTORY OF PRESENT ILLNESS (per record) Emily Franklin is a 82 y.o. female with history of HTN, dementia, falls occuring this year resulting in SDH in April resulting in R hip fracture s/p surgery.  Patient is a resident of a skilled nursing facility due to recent hip fracture. Last seen normal time is unclear earlier today was found to have right-sided hemiparesis and aphasia. Patient has poor baseline due to underlying dementia Patient was sent to [facility] ED where she was initially evaluated and subsequently was transferred to [facility] for further management. Per their report CT scan of the head did not show any acute abnormality. Clinically the patient seen in encephalopathic has dense right-sided hemiparesis and significant aphasia. Patient was not administered IV t-PA due to unclear LKW. She was admitted for further evaluation and treatment.   SUBJECTIVE (INTERVAL HISTORY) Sister at bedside. I also talked with daughter over the phone. Both of them do not know pt has hx of afib or not. However, tele showed afib rhythm. EKG pending. Had left hip fracture 5 years ago and in 04/2015 had right hip fracture. Has been in NH for 6 years, baseline dementia, not ambulating, verbal but limited output and content.    OBJECTIVE Temp:  [97.7 F (36.5 C)-98.5 F (36.9 C)] 98.2 F (36.8 C) (06/19 0654) Pulse Rate:  [59-79] 79 (06/19 0654) Cardiac Rhythm:  [-] Other (Comment) (06/19 0707) Resp:  [18] 18 (06/19 0654) BP: (135-186)/(71-83) 135/81 mmHg (06/19 0654) SpO2:  [84 %-100 %] 100 % (06/19 0654) Weight:  [64.365 kg (141 lb 14.4 oz)] 64.365 kg (141 lb 14.4 oz) (06/18 2232)  CBC:  Recent Labs Lab 07/13/15 0658  WBC 8.7  HGB 11.4*  HCT 35.5*  MCV 88.5  PLT 267    Basic Metabolic Panel:  Recent Labs Lab 07/13/15 0658  NA 135  K 3.4*  CL 101  CO2 23  GLUCOSE 99  BUN 11  CREATININE 0.89  CALCIUM 10.2    Lipid Panel:    Component Value Date/Time   CHOL  188 07/13/2015 0658   TRIG 173* 07/13/2015 0658   HDL 30* 07/13/2015 0658   CHOLHDL 6.3 07/13/2015 0658   VLDL 35 07/13/2015 0658   LDLCALC 123* 07/13/2015 0658   HgbA1c: No results found for: HGBA1C Urine Drug Screen: No results found for: LABOPIA, COCAINSCRNUR, LABBENZ, AMPHETMU, THCU, LABBARB    IMAGING I have personally reviewed the radiological images below and agree with the radiology interpretations.  CT HEAD  07/13/2015   No intracranial hemorrhage. Significant white matter changes limits evaluation for detection of acute infarct. No large acute infarct detected. No intracranial enhancing lesion. Global atrophy without hydrocephalus.   CTA NECK  07/13/2015   Mild irregularity and narrowing without high-grade stenosis involving either common carotid artery, either internal carotid artery or either vertebral artery. Degenerative changes cervical spine most prominent C5-6. Scattered Schmorl's node deformities with mild loss of height superior endplate T2 on T3. Scoliosis cervical and thoracic spine. Heterogeneous thyroid gland with largest nodule right lobe measuring up to 2 cm. This can be assessed/ followed with thyroid ultrasound.   CTA HEAD Calcified cavernous segment internal carotid artery with mild narrowing bilaterally. Right internal carotid artery supraclinoid segment calcification with mild to moderate narrowing. Mild narrowing and irregularity M1 segment middle cerebral artery bilaterally. M2/M3 branch vessel moderate narrowing bilaterally. Moderate narrowing A1 segment and A2 segment left anterior cerebral artery with mild narrowing right anterior cerebral artery A1 segment.  Mild narrowing distal vertebral arteries bilaterally. Markedly ectatic basilar artery. Marked tandem stenosis posterior cerebral artery bilaterally involving proximal mid and distal aspect bilaterally.   MRI - pending  2D echo - pending  EKG - pending   PHYSICAL EXAM Physical exam  Temp:  [97.7 F  (36.5 C)-99 F (37.2 C)] 98.5 F (36.9 C) (06/19 1613) Pulse Rate:  [59-101] 97 (06/19 1613) Resp:  [16-20] 20 (06/19 1613) BP: (120-186)/(71-92) 152/92 mmHg (06/19 1613) SpO2:  [84 %-100 %] 100 % (06/19 1613) Weight:  [141 lb 14.4 oz (64.365 kg)] 141 lb 14.4 oz (64.365 kg) (06/18 2232)  General - Well nourished, well developed, in no apparent distress.  Ophthalmologic - Fundi not visualized due to noncooperation.  Cardiovascular - irregularly irregular heart rate and rhythm.  Mental Status -  Awake alert, not able to answer orientation questions. Just say" OK", " you are beautiful". No neglect. Able to follow some simple commands. No cooperative for naming or repeating.  Cranial Nerves II - XII - II - blinking to visual threat bilaterally. III, IV, VI - Extraocular movements intact, no eyes gaze preference. V - Facial sensation intact bilaterally. VII - right facial droop. VIII - Hearing & vestibular intact bilaterally. X - Palate elevates symmetrically. XI - Chin turning & shoulder shrug intact bilaterally. XII - Tongue protrusion intact.  Motor Strength - The patient's strength was 5-/5 LUE, 0/5 RUE, 2/5 BLEs.  Bulk was normal and fasciculations were absent.   Motor Tone - Muscle tone was assessed at the neck and appendages and was normal.  Reflexes - The patient's reflexes were 1+ in all extremities and she had no pathological reflexes.  Sensory - not cooperative.    Coordination - not cooperative.  Tremor was absent.  Gait and Station - not tested.   ASSESSMENT/PLAN Ms. Emily Franklin is a 82 y.o. female with history of hypertension, dementia, recent history of falls resulting in right hip fracture s/p surgery found to have dense R hemiparesis at her nursing home where she was undergoing rehab. She did not receive IV t-PA due to unclear LKW.   Stroke: likely left brain infarct, embolic secondary to new diagnosed atrial fibrillation not on [facility]  Resultant  Right facial  droop and right UE flaccid  MRI  pending  CT head  Atrophy. Ex vacuo hydrocephalus  CTA head  Diffuse mild atherosclerosis  CTA neck  No high-grade stenosis  2D Echo  Pending  LDL 123  HgbA1c pending  Lovenox 40 mg sq daily for VTE prophylaxis  Diet NPO time specified  aspirin 325 mg daily prior to admission, now on aspirin 300 mg suppository daily  Ongoing aggressive stroke risk factor management  Therapy recommendations:  pending   Disposition:  pending  (SNF resident for rehab post THR)  Atrial Fibrillation  Home anticoagulation:  none   Daughter and sister were not aware of the diagnosis  Tele showed afib  EKG pending  Will put on DOACs if Afib found    Hypertension  Stable  Permissive hypertension (OK if < 220/120) but gradually normalize in 5-7 days  Long-term BP goal normotensive  Hyperlipidemia  Home meds:  No statin listed  LDL 123, goal < 70  Add statin once passed swallow  Continue statin at discharge  Other Stroke Risk Factors  Advanced age  Family hx stroke (Maternal Grandmother)  Other Active Problems  Baseline dementia  GERD  Hx hip fx following fall in April 2017  Hospital day #  1  Marvel Plan, MD PhD Stroke Neurology 07/13/2015 5:36 PM   To contact Stroke Continuity provider, please refer to [email or web address]. After hours, contact General Neurology

## 2015-07-13 NOTE — Progress Notes (Signed)
Late entry for 07/12/15 at 2300:  Unable to do admission.  Family is not present. Patient is dysarthric. Attempted neuro checks, however, patient is unable to communicate. Reported this to oncoming nurse. Dorothe PeaKaren Petula Rotolo, RN

## 2015-07-13 NOTE — Clinical Social Work Note (Signed)
Clinical Social Work Assessment  Patient Details  Name: Emily Franklin MRN: [id number] Date of Birth: 11-Mar-1923  Date of referral:  07/13/15               Reason for consult:  Facility Placement                Permission sought to share information with:  Family Supports, Oceanographeracility Contact Representative Permission granted to share information::  Yes, Verbal Permission Granted  Name::     Tommie Best  Agency::  [facility]  Relationship::  dtr  Contact Information:     Housing/Transportation Living arrangements for the past 2 months:  [facility]illed [facility] Source of Information:    Patient Interpreter Needed:  None Criminal Activity/Legal Involvement Pertinent to Current Situation/Hospitalization:  No - Comment as needed Significant Relationships:  None Lives with:  Facility Resident Do you feel safe going back to the place where you live?  No Need for family participation in patient care:  No (Coment)  Care giving concerns: Pt is long term resident at [facility]- no concerns with pt return there when stable for DC.   Social Worker assessment / plan:  CSW discussed plan for pt at time of DC with dtr, Tommie.  Employment status:  Retired Health and safety inspectornsurance information:  Medicare PT Recommendations:  Not assessed at this time Information / Referral to community resources:  Skilled Nursing Facility  Patient/Family's Response to care:  Pt dtr is agreeable to pt return to [facility]  when ready.  Patient/Family's Understanding of and Emotional Response to Diagnosis, Current Treatment, and Prognosis:  Hopeful pt will be able to return to [facility] soon- concerned about pt outcome.  Emotional Assessment Appearance:  Appears stated age Attitude/Demeanor/Rapport:    Affect (typically observed):  Appropriate Orientation:    Alcohol / Substance use:  Not Applicable Psych involvement (Current and /or in the community):  No (Comment)  Discharge Needs  Concerns to be  addressed:  Care Coordination Readmission within the last 30 days:  No Current discharge risk:  None Barriers to Discharge:  Continued Medical Work up   Peabody EnergyHoloman, Brenn Gatton M, LCSW 07/13/2015, 1:37 PM

## 2015-07-13 NOTE — NC FL2 (Signed)
[place] MEDICAID FL2 LEVEL OF CARE SCREENINGTERALYN MULLINSDENTIFICATION  Patient Name: Emily Franklin Birthdate: 09-16-1923 Sex: female Admission Date (Current Location): 07/12/2015  [facility] and [place] Number:  [employer] and Address:  [facility], [address], [place], [place] [address]      Provider Number: [id number]  Attending Physician Name and Address:  Vassie Loll, MD  Relative Name and Phone Number:       Current Level of Care: Hospital Recommended Level of Care: Skilled Nursing Facility Prior Approval Number:    Date Approved/Denied:   PASRR Number: [id number]  Discharge Plan: SNF    Current Diagnoses: Patient Active Problem List   Diagnosis Date Noted  . HTN (hypertension) 07/12/2015  . Acute ischemic stroke (HCC) 07/12/2015  . Dementia 07/12/2015    Orientation RESPIRATION BLADDER Height & Weight      (DOx4)  Normal Incontinent Weight: 64.365 kg (141 lb 14.4 oz) Height:   (160 cm) (per. family )  BEHAVIORAL SYMPTOMS/MOOD NEUROLOGICAL BOWEL NUTRITION STATUS      Continent Diet  AMBULATORY STATUS COMMUNICATION OF NEEDS Skin   Extensive Assist Verbally Normal                       Personal Care Assistance Level of Assistance  Bathing, Feeding, Dressing Bathing Assistance: Maximum assistance Feeding assistance: Maximum assistance Dressing Assistance: Maximum assistance     Functional Limitations Info             SPECIAL CARE FACTORS FREQUENCY  PT (By licensed PT), OT (By licensed OT)                    Contractures      Additional Factors Info  Code Status, Allergies Code Status Info: DNR Allergies Info: Codeine           Current Medications (07/13/2015):  This is the current hospital active medication list Current Facility-Administered Medications  Medication Dose Route Frequency Provider Last Rate Last Dose  . 0.9 %  sodium chloride infusion   Intravenous Continuous  Zulfiqar Willodean Rosenthal, MD 50 mL/hr at 07/13/15 0141 1,000 mL at 07/13/15 0141  . acetaminophen (TYLENOL) tablet 650 mg  650 mg Oral Q6H PRN Hillary Bow, DO      . ALPRAZolam Prudy Feeler) tablet 0.25 mg  0.25 mg Oral TID PRN Hillary Bow, DO      . antiseptic oral rinse (CPC / CETYLPYRIDINIUM CHLORIDE 0.05%) solution 7 mL  7 mL Mouth Rinse BID Vassie Loll, MD      . aspirin suppository 300 mg  300 mg Rectal Daily Zulfiqar Willodean Rosenthal, MD   300 mg at 07/13/15 0141  . enoxaparin (LOVENOX) injection 40 mg  40 mg Subcutaneous QHS Hillary Bow, DO   40 mg at 07/13/15 0012  . magnesium hydroxide (MILK OF MAGNESIA) suspension 30 mL  30 mL Oral Daily PRN Hillary Bow, DO      . memantine [facility]) tablet 10 mg  10 mg Oral BID Hillary Bow, DO   10 mg at 07/13/15 0014  . metoprolol tartrate (LOPRESSOR) tablet 25 mg  25 mg Oral BID Hillary Bow, DO   25 mg at 07/13/15 0014  . morphine 2 MG/ML injection 0.5-2 mg  0.5-2 mg Intravenous Q4H PRN Hillary Bow, DO   1 mg at 07/13/15 0003  . oxyCODONE (Oxy IR/ROXICODONE) immediate release tablet 2.5  mg  2.5 mg Oral Q8H PRN Hillary BowJared M Gardner, DO      . pantoprazole (PROTONIX) EC tablet 40 mg  40 mg Oral Daily Hillary BowJared M Gardner, DO      . PARoxetine (PAXIL) tablet 20 mg  20 mg Oral Daily Hillary BowJared M Gardner, DO      . polyethylene glycol (MIRALAX / GLYCOLAX) packet 17 g  17 g Oral QODAY Hillary BowJared M Gardner, DO      . senna-docusate (Senokot-S) tablet 2 tablet  2 tablet Oral QHS Hillary BowJared M Gardner, DO   2 tablet at 07/13/15 0015     Discharge Medications: Please see discharge summary for a list of discharge medications.  Relevant Imaging Results:  Relevant Lab Results:   Additional Information SS#: [id number]  Izora RibasHoloman, Lindie Roberson M, [place]

## 2015-07-13 NOTE — Care Management Obs Status (Signed)
MEDICARE OBSERVATION STATUS NOTIFICATION   Patient Details  Name: Emily Franklin MRN: [id number] Date of Birth: Jan 04, 1924   Medicare Observation Status Notification Given:  Yes    Lawerance Sabalebbie Sanii Kukla, RN 07/13/2015, 2:13 PM

## 2015-07-13 NOTE — Progress Notes (Signed)
Pt is LTC patient at [facility]- CSW confirmed with facility no barriers to pt return when medically stable  CSW attempted to contact family- unable to leave message  Merlyn LotJenna Holoman, [facility] Community HospitalCSWA Clinical Social Worker [phone]

## 2015-07-13 NOTE — Care Management Note (Signed)
Case Management Note  Patient Details  Name: Emily Franklin MRN: [id number] Date of Birth: 1923/12/12  Subjective/Objective:                 Patient admitted from [facility] SNF with symptoms of CVA. Work up pending   Action/Plan:  CSW to facilitate DC to SNF when medically stable.  Expected Discharge Date:  07/15/15               Expected Discharge Plan:  Skilled Nursing Facility  In-House Referral:  Clinical Social Work  Discharge planning Services  CM Consult  Post Acute Care Choice:    Choice offered to:     DME Arranged:    DME Agency:     HH Arranged:    HH Agency:     Status of Service:  In process, will continue to follow  Medicare Important Message Given:    Date Medicare IM Given:    Medicare IM give by:    Date Additional Medicare IM Given:    Additional Medicare Important Message give by:     If discussed at Long Length of Stay Meetings, dates discussed:    Additional Comments:  Lawerance SabalDebbie Kalyn Dimattia, RN 07/13/2015, 10:18 AM

## 2015-07-14 ENCOUNTER — Inpatient Hospital Stay (HOSPITAL_COMMUNITY): Payer: Medicare Other

## 2015-07-14 DIAGNOSIS — I639 Cerebral infarction, unspecified: Secondary | ICD-10-CM | POA: Insufficient documentation

## 2015-07-14 DIAGNOSIS — I6789 Other cerebrovascular disease: Secondary | ICD-10-CM

## 2015-07-14 LAB — BASIC METABOLIC PANEL
Anion gap: 12 (ref 5–15)
BUN: 11 mg/dL (ref 6–20)
CHLORIDE: 103 mmol/L (ref 101–111)
CO2: 22 mmol/L (ref 22–32)
CREATININE: 0.89 mg/dL (ref 0.44–1.00)
Calcium: 9.6 mg/dL (ref 8.9–10.3)
GFR calc Af Amer: >60 mL/min (ref >60)
GFR calc non Af Amer: 56 mL/min — ABNORMAL LOW (ref >60)
GLUCOSE: 109 mg/dL — AB (ref 65–99)
Potassium: 3.3 mmol/L — ABNORMAL LOW (ref 3.5–5.1)
Sodium: 137 mmol/L (ref 135–145)

## 2015-07-14 LAB — ECHOCARDIOGRAM COMPLETE
CHL CUP MV DEC (S): 278
E decel time: 278 msec
E/e' ratio: 12.08
FS: 29 % (ref 28–44)
Height: 63 in
IVS/LV PW RATIO, ED: 1.01
LA ID, A-P, ES: 37 mm
LADIAMINDEX: 2.22 cm/m2
LAVOLA4C: 58.5 mL
LDCA: 2.27 cm2
LEFT ATRIUM END SYS DIAM: 37 mm
LV E/e' medial: 12.08
LV E/e'average: 12.08
LV PW d: 12.5 mm — AB (ref 0.6–1.1)
LV TDI E'LATERAL: 7.51
LVELAT: 7.51 cm/s
LVOTD: 17 mm
MV Peak grad: 3 mmHg
MV pk A vel: 114 m/s
MV pk E vel: 90.7 m/s
RV TAPSE: 26 mm
Reg peak vel: 323 cm/s
TDI e' medial: 5.11
TR max vel: 323 cm/s
Weight: 2270.4 oz

## 2015-07-14 LAB — CBC
HEMATOCRIT: 35.3 % — AB (ref 36.0–46.0)
HEMOGLOBIN: 11.5 g/dL — AB (ref 12.0–15.0)
MCH: 28.8 pg (ref 26.0–34.0)
MCHC: 32.6 g/dL (ref 30.0–36.0)
MCV: 88.5 fL (ref 78.0–100.0)
Platelets: 253 10*3/uL (ref 150–400)
RBC: 3.99 MIL/uL (ref 3.87–5.11)
RDW: 12.7 % (ref 11.5–15.5)
WBC: 9.9 10*3/uL (ref 4.0–10.5)

## 2015-07-14 LAB — HEMOGLOBIN A1C
Hgb A1c MFr Bld: 5.4 % (ref 4.8–5.6)
MEAN PLASMA GLUCOSE: 108 mg/dL

## 2015-07-14 MED ORDER — MEMANTINE HCL 10 MG PO TABS
10.0000 mg | ORAL_TABLET | Freq: Two times a day (BID) | ORAL | Status: DC
  Administered 2015-07-14 – 2015-07-15 (×2): 10 mg via ORAL
  Filled 2015-07-14 (×4): qty 1

## 2015-07-14 MED ORDER — PRAVASTATIN SODIUM 20 MG PO TABS: 20.0000 mg | ORAL_TABLET | Freq: Every day | ORAL | Status: DC

## 2015-07-14 MED ORDER — PANTOPRAZOLE SODIUM 40 MG PO TBEC
40.0000 mg | DELAYED_RELEASE_TABLET | Freq: Every day | ORAL | Status: DC
  Administered 2015-07-15: 40 mg via ORAL
  Filled 2015-07-14: qty 1

## 2015-07-14 MED ORDER — VITAMIN B-12 1000 MCG PO TABS
2500.0000 ug | ORAL_TABLET | Freq: Every day | ORAL | Status: DC
  Administered 2015-07-15: 2500 ug via ORAL
  Filled 2015-07-14: qty 5

## 2015-07-14 MED ORDER — PAROXETINE HCL 20 MG PO TABS
20.0000 mg | ORAL_TABLET | Freq: Every day | ORAL | Status: DC
  Administered 2015-07-14: 20 mg via ORAL
  Filled 2015-07-14: qty 1

## 2015-07-14 MED ORDER — VITAMIN B-12 2500 MCG SL SUBL: 1.0000 | SUBLINGUAL_TABLET | Freq: Every day | SUBLINGUAL | Status: DC

## 2015-07-14 MED ORDER — APIXABAN 2.5 MG PO TABS
2.5000 mg | ORAL_TABLET | Freq: Two times a day (BID) | ORAL | Status: DC
  Administered 2015-07-14 – 2015-07-15 (×2): 2.5 mg via ORAL
  Filled 2015-07-14 (×2): qty 1

## 2015-07-14 MED ORDER — METOPROLOL TARTRATE 50 MG PO TABS
50.0000 mg | ORAL_TABLET | Freq: Every day | ORAL | Status: DC
  Administered 2015-07-15: 50 mg via ORAL
  Filled 2015-07-14: qty 1

## 2015-07-14 NOTE — Progress Notes (Signed)
  Echocardiogram 2D Echocardiogram has been performed.  Emily Franklin, Emily Franklin 07/14/2015, 11:41 AM

## 2015-07-14 NOTE — Progress Notes (Signed)
[facility] PROGRESS NOTE  Hillary Bowhelma L Calvert ZOX:[id number] DOB: Jun 15, 1923 DOA: 07/12/2015 PCP: No PCP Per Patient  Interim summary and HPI 82 y.o. female with medical history significant of HTN, dementia, falls occuring this year resulting in SDH in April and a R hip fracture in April. Patient was in SNF following hip fracture, was actually not doing too well at baseline (with PT could take about 3 steps assisted and that's all). This morning daughter went to see patient in NH and noticed slurred speech, R sided upper, lower, weakness, facial droop. [facility] normal at breakfast time and ate breakfast, but even that isnt very clear. What is clear is that it is now 11:30 at night and patient is well out of any interventional window (>12 hours since LKW).  Complete stroke work up. Will need SNF at discharge. Patient was evaluated by speech therapy and the recommendation was for dysphagia 1 diet with crushed medications. At this moment will allow 24 hours to assess patient swallowing capacity with adjusted diet before transitioning medications to by mouth.  Assessment/Plan: 1-stroke like symptoms: likely left brain infarct; with residual right facial droop and right hemiparesis (affecting arm > leg) -will continue aspirin rectally, while NPO -complete stroke work up (2-D echo and MRI pending at this moment). -patient found to be on A. Fib (newly found); this could be reason for stroke. Will assess candidacy for anticoagulation -LDL 123; will need statins once able to take PO's -will follow rec's from neurology service  -continue PT/OT and SPL  2-A. Fib -new diagnosis -will continue telemetry -2-D echo pending   3-HLD -statins when able to take PO -LDL 123  4-HTN: -stable -will be permissive with hypertension currently  5-Dementia: -continue supportive care  6-GERD: -continue home PPI when able to take PO's -will use pepcid IV now    DVT prophylaxis:  lovenox Code Status: DNR Family Communication: no family at bedside  Disposition Plan: will need SNF at discharge base on recommendations from PT/OT   Consultants:  Neurology   Procedures:  MRI pending  CTA head and neck: diffuse mild atherosclerosis and no signs of high stenosis   2-D echo pending  Antibiotics:  None   HPI/Subjective: With positive right facial droop and right hemiparesis. Patient oriented only to person and with ongoing slurred speech appreciated.  Objective: Filed Vitals:   07/14/15 0915 07/14/15 1414  BP: 161/65 147/60  Pulse: 71 75  Temp: 98.9 F (37.2 C) 99.5 F (37.5 C)  Resp: 20 20    Intake/Output Summary (Last 24 hours) at 07/14/15 1629 Last data filed at 07/14/15 1239  Gross per 24 hour  Intake     60 ml  Output      0 ml  Net     60 ml   Filed Weights   07/12/15 2232  Weight: 64.365 kg (141 lb 14.4 oz)    Exam:   General:  Pleasantly confused and oriented only to person. Able to follow simple commands intermittently, but require significant cueing. No chest pain, no shortness of breath. Patient is afebrile  Cardiovascular: irregular, no rubs or gallops, no JVD  Respiratory: CTA bilaterally   Abdomen: soft, NT, ND, positive BS  Musculoskeletal: no cyanosis or edema  Neurology exam: positive right facial droop, dense right side hemiparesis/neck flexion and positive slurred speech (even improved some from description on admission).  Data Reviewed: Basic Metabolic Panel:  Recent Labs Lab 07/13/15 0658 07/14/15 0628  NA 135 137  K 3.4* 3.3*  CL 101 103  CO2 23 22  GLUCOSE 99 109*  BUN 11 11  CREATININE 0.89 0.89  CALCIUM 10.2 9.6   CBC:  Recent Labs Lab 07/13/15 0658 07/14/15 0628  WBC 8.7 9.9  HGB 11.4* 11.5*  HCT 35.5* 35.3*  MCV 88.5 88.5  PLT 267 253   CBG: No results for input(s): GLUCAP in the last 168 hours.  Recent Results (from the past 240 hour(s))  MRSA PCR Screening     Status: None    Collection Time: 07/13/15  5:22 AM  Result Value Ref Range Status   MRSA by PCR NEGATIVE NEGATIVE Final    Comment:        The GeneXpert MRSA Assay (FDA approved for NASAL specimens only), is one component of a comprehensive MRSA colonization surveillance program. It is not intended to diagnose MRSA infection nor to guide or monitor treatment for MRSA infections.      Studies: Ct Angio Head W Or Wo Contrast  07/13/2015  CLINICAL DATA:  82 year old hypertensive female with history of subdural hematoma and dementia presenting with dense right hemiparesis and aphasia. Subsequent encounter. EXAM: CT ANGIOGRAPHY HEAD AND NECK TECHNIQUE: Multidetector CT imaging of the head and neck was performed using the standard protocol during bolus administration of intravenous contrast. Multiplanar CT image reconstructions and MIPs were obtained to evaluate the vascular anatomy. Carotid stenosis measurements (when applicable) are obtained utilizing NASCET criteria, using the distal internal carotid diameter as the denominator. CONTRAST:  50 cc Isovue 370. COMPARISON:  07/12/2015 and 05/20/2015 head CT. FINDINGS: CT HEAD Brain: No intracranial hemorrhage. Significant white matter changes limited evaluation for detection of acute infarct. No large acute infarct detected. No intracranial enhancing lesion. Global atrophy without hydrocephalus. Calvarium and skull base: Negative. Paranasal sinuses: Opacification diminutive size right sphenoid sinus. Orbits: Post lens replacement.  Probable calcification bilaterally. CTA NECK Aortic arch: Ectatic with atherosclerotic changes. Right carotid system: Mild irregularity and narrowing without high-grade stenosis involving the right common carotid artery or right internal carotid artery. Moderate narrowing origin right external carotid artery. Left carotid system: Irregularity with slight narrowing left common carotid artery and internal carotid artery without significant  narrowing. Vertebral arteries:Slight irregularity without high-grade stenosis. Skeleton: Degenerative changes cervical spine most prominent C5-6. Scattered Schmorl's node deformities with mild loss of height superior endplate T2 on T3. Scoliosis cervical and thoracic spine. Other neck: Heterogeneous thyroid gland with largest nodule right lobe measuring up to 2 cm. This can be assessed/ followed with thyroid ultrasound. No worrisome lung apical mass. CTA HEAD Anterior circulation: Calcified cavernous segment internal carotid artery with mild narrowing bilaterally. Right internal carotid artery supraclinoid segment calcification with mild moderate narrowing. Mild narrowing and irregularity M1 segment middle cerebral artery bilaterally. M2/M3 branch vessel moderate narrowing bilaterally. Moderate narrowing A1 segment and A2 segment left anterior cerebral artery with mild narrowing right anterior cerebral artery A1 segment. Posterior circulation: Mild narrowing distal vertebral arteries bilaterally. Markedly ectatic basilar artery. Marked tandem stenosis posterior cerebral artery bilaterally involving proximal mid and distal aspect bilaterally. Venous sinuses: Patent. Anatomic variants: Negative Delayed phase: As above. IMPRESSION: CT HEAD No intracranial hemorrhage. Significant white matter changes limits evaluation for detection of acute infarct. No large acute infarct detected. No intracranial enhancing lesion. Global atrophy without hydrocephalus. CTA NECK Mild irregularity and narrowing without high-grade stenosis involving either common carotid artery, either internal carotid artery or either vertebral artery. Degenerative changes cervical spine most prominent C5-6. Scattered Schmorl's node deformities with mild  loss of height superior endplate T2 on T3. Scoliosis cervical and thoracic spine. Heterogeneous thyroid gland with largest nodule right lobe measuring up to 2 cm. This can be assessed/ followed with  thyroid ultrasound. CTA HEAD Calcified cavernous segment internal carotid artery with mild narrowing bilaterally. Right internal carotid artery supraclinoid segment calcification with mild to moderate narrowing. Mild narrowing and irregularity M1 segment middle cerebral artery bilaterally. M2/M3 branch vessel moderate narrowing bilaterally. Moderate narrowing A1 segment and A2 segment left anterior cerebral artery with mild narrowing right anterior cerebral artery A1 segment. Mild narrowing distal vertebral arteries bilaterally. Markedly ectatic basilar artery. Marked tandem stenosis posterior cerebral artery bilaterally involving proximal mid and distal aspect bilaterally. Electronically Signed   By: Lacy Duverney M.D.   On: 07/13/2015 11:35   Dg Chest 2 View  07/13/2015  CLINICAL DATA:  Stroke EXAM: CHEST  2 VIEW COMPARISON:  05/15/2015 FINDINGS: Stable widened appearance of mediastinum much of which is due to vascular tortuosity. The patient is rotated to the right on today's radiograph, reducing diagnostic sensitivity and specificity. Bony demineralization noted. Blunting of the left lateral costophrenic angle. Mild enlargement of the cardiopericardial silhouette. Several compression fractures are noted in the thoracic spine, 1 in the mid to lower thoracic spine on the lateral projection with about 50% loss of height. There is also a mid lumbar compression fracture. Deformity of the left proximal humerus noted, healed. IMPRESSION: 1. Prominent mediastinum ascribed to tortuous vasculature. Mild enlargement of the cardiopericardial silhouette. 2. Bony demineralization with several thoracic and lumbar compression fractures, which may well be old. 3. Trace left pleural effusion, with blunting of the left lateral costophrenic angle. Electronically Signed   By: Gaylyn Rong M.D.   On: 07/13/2015 18:38   Ct Angio Neck W Or Wo Contrast  07/13/2015  CLINICAL DATA:  82 year old hypertensive female with history  of subdural hematoma and dementia presenting with dense right hemiparesis and aphasia. Subsequent encounter. EXAM: CT ANGIOGRAPHY HEAD AND NECK TECHNIQUE: Multidetector CT imaging of the head and neck was performed using the standard protocol during bolus administration of intravenous contrast. Multiplanar CT image reconstructions and MIPs were obtained to evaluate the vascular anatomy. Carotid stenosis measurements (when applicable) are obtained utilizing NASCET criteria, using the distal internal carotid diameter as the denominator. CONTRAST:  50 cc Isovue 370. COMPARISON:  07/12/2015 and 05/20/2015 head CT. FINDINGS: CT HEAD Brain: No intracranial hemorrhage. Significant white matter changes limited evaluation for detection of acute infarct. No large acute infarct detected. No intracranial enhancing lesion. Global atrophy without hydrocephalus. Calvarium and skull base: Negative. Paranasal sinuses: Opacification diminutive size right sphenoid sinus. Orbits: Post lens replacement.  Probable calcification bilaterally. CTA NECK Aortic arch: Ectatic with atherosclerotic changes. Right carotid system: Mild irregularity and narrowing without high-grade stenosis involving the right common carotid artery or right internal carotid artery. Moderate narrowing origin right external carotid artery. Left carotid system: Irregularity with slight narrowing left common carotid artery and internal carotid artery without significant narrowing. Vertebral arteries:Slight irregularity without high-grade stenosis. Skeleton: Degenerative changes cervical spine most prominent C5-6. Scattered Schmorl's node deformities with mild loss of height superior endplate T2 on T3. Scoliosis cervical and thoracic spine. Other neck: Heterogeneous thyroid gland with largest nodule right lobe measuring up to 2 cm. This can be assessed/ followed with thyroid ultrasound. No worrisome lung apical mass. CTA HEAD Anterior circulation: Calcified cavernous  segment internal carotid artery with mild narrowing bilaterally. Right internal carotid artery supraclinoid segment calcification with mild moderate narrowing. Mild narrowing  and irregularity M1 segment middle cerebral artery bilaterally. M2/M3 branch vessel moderate narrowing bilaterally. Moderate narrowing A1 segment and A2 segment left anterior cerebral artery with mild narrowing right anterior cerebral artery A1 segment. Posterior circulation: Mild narrowing distal vertebral arteries bilaterally. Markedly ectatic basilar artery. Marked tandem stenosis posterior cerebral artery bilaterally involving proximal mid and distal aspect bilaterally. Venous sinuses: Patent. Anatomic variants: Negative Delayed phase: As above. IMPRESSION: CT HEAD No intracranial hemorrhage. Significant white matter changes limits evaluation for detection of acute infarct. No large acute infarct detected. No intracranial enhancing lesion. Global atrophy without hydrocephalus. CTA NECK Mild irregularity and narrowing without high-grade stenosis involving either common carotid artery, either internal carotid artery or either vertebral artery. Degenerative changes cervical spine most prominent C5-6. Scattered Schmorl's node deformities with mild loss of height superior endplate T2 on T3. Scoliosis cervical and thoracic spine. Heterogeneous thyroid gland with largest nodule right lobe measuring up to 2 cm. This can be assessed/ followed with thyroid ultrasound. CTA HEAD Calcified cavernous segment internal carotid artery with mild narrowing bilaterally. Right internal carotid artery supraclinoid segment calcification with mild to moderate narrowing. Mild narrowing and irregularity M1 segment middle cerebral artery bilaterally. M2/M3 branch vessel moderate narrowing bilaterally. Moderate narrowing A1 segment and A2 segment left anterior cerebral artery with mild narrowing right anterior cerebral artery A1 segment. Mild narrowing distal  vertebral arteries bilaterally. Markedly ectatic basilar artery. Marked tandem stenosis posterior cerebral artery bilaterally involving proximal mid and distal aspect bilaterally. Electronically Signed   By: Lacy Duverney M.D.   On: 07/13/2015 11:35   Mr Brain Wo Contrast  07/14/2015  CLINICAL DATA:  82 year old female with right side severe weakness and aphasia. Initial encounter. EXAM: MRI HEAD WITHOUT CONTRAST TECHNIQUE: Multiplanar, multiecho pulse sequences of the brain and surrounding structures were obtained without intravenous contrast. COMPARISON:  CTA head and neck 07/13/2015 and earlier. FINDINGS: Study is mildly degraded by motion artifact despite repeated imaging attempts. 15 mm area of confluent restricted diffusion in the left paracentral pons (series 5, image 14) with T2 hyperintensity but no associated hemorrhage or mass effect. No other restricted diffusion identified. Major intracranial vascular flow voids are preserved, with vertebrobasilar dolichoectasia. Confluent bilateral cerebral white matter T2 and FLAIR hyperintensity. T2 heterogeneity throughout the deep gray matter nuclei which appears in part related to perivascular spaces. Chronic lacunar infarct in the left thalamus. No chronic cerebral blood products. Other brain structures appear within normal limits for age. No midline shift, mass effect, evidence of mass lesion, ventriculomegaly, extra-axial collection or acute intracranial hemorrhage. Cervicomedullary junction and pituitary are within normal limits. Negative visualized cervical spine. Normal bone marrow signal. Stable and well pneumatized visualized paranasal sinuses and mastoids. Visible internal auditory structures appear normal. No acute orbit or scalp soft tissue findings. IMPRESSION: 1. Acute infarct in the left pons. No associated hemorrhage or mass effect. 2. No other acute intracranial abnormality. Chronic signal changes in the cerebral white matter and deep gray  matter most compatible with chronic small vessel disease. Electronically Signed   By: Odessa Fleming M.D.   On: 07/14/2015 14:10   Dg Swallowing Func-speech Pathology  07/14/2015  Objective Swallowing Evaluation: Type of Study: MBS-Modified Barium Swallow Study Patient Details Name: KEYMANI GLYNN MRN: [id number] Date of Birth: 12-Oct-1923 Today's Date: 07/14/2015 Time: SLP Start Time (ACUTE ONLY): 0745-SLP Stop Time (ACUTE ONLY): 0820 SLP Time Calculation (min) (ACUTE ONLY): 35 min Past Medical History: Past Medical History Diagnosis Date . Closed right hip fracture (HCC)  Just recently had this occur and surgical repair . Dementia  . HTN (hypertension)  . A-fib (HCC)  . GERD (gastroesophageal reflux disease)  . SDH (subdural hematoma) [facility]) April   Resolved on most recent CT Past Surgical History: Past Surgical History Procedure Laterality Date . Abdominal hysterectomy   . Hip surgery     Prior L hip surgery, recent R hip surgery for fracture HPI: Patient is a 82 y/o female with hx of dementia, HTN, A-fib, GERD, SDH and recent hip fx presents from SNF with slurred speech, R sided upper, weakness and facial droop. MRI still pending. MBS indicated due to significant dysphagia noted at bedside.   Subjective: patient laying in bed confused but present Assessment / Plan / Recommendation CHL IP CLINICAL IMPRESSIONS 07/14/2015 Therapy Diagnosis Moderate oral phase dysphagia;Moderate pharyngeal phase dysphagia Clinical Impression Moderate oropharyngeal dysphagia with sensorimotor deficits.  Decreased oral coordination resulted in premature spillage of boluses into pharynx.  Pharyngeal swallow was delayed to vallecular space - spiling over epiglottis - resulting in aspiration BEFORE the swallow with nectar straw/cup and thin.  Aspiration initially was audible but cough was ineffective to clear.  Later in study, pt with silent aspiration of even secretions.  Pt did not aspirate honey, nectar via tsp nor solids including  puree/cereal bar.   Wet voice noted at end of study = but cleared with cued throat clear and swallow.  Recommend very strict aspiration precautions and absolute supervision. Due to pt's cognitive deficits and oral deficits, SLP did not test chin tuck compensation strategies.  Impact on safety and function Risk for inadequate nutrition/hydration;Moderate aspiration risk   CHL IP TREATMENT RECOMMENDATION 07/14/2015 Treatment Recommendations Therapy as outlined in treatment plan below   Prognosis 07/14/2015 Prognosis for Safe Diet Advancement Fair Barriers to Reach Goals Severity of deficits;Cognitive deficits Barriers/Prognosis Comment -- CHL IP DIET RECOMMENDATION 07/14/2015 SLP Diet Recommendations Dysphagia 1 (Puree) solids;Nectar thick liquid Liquid Administration via Spoon Medication Administration Crushed with puree Compensations Minimize environmental distractions;Slow rate;Small sips/bites;Other (Comment) Postural Changes Seated upright at 90 degrees;Remain semi-upright after after feeds/meals (Comment)   CHL IP OTHER RECOMMENDATIONS 07/14/2015 Recommended Consults -- Oral Care Recommendations Oral care before and after PO Other Recommendations Have oral suction available   CHL IP FOLLOW UP RECOMMENDATIONS 07/14/2015 Follow up Recommendations Skilled Nursing facility   [facility] IP FREQUENCY AND DURATION 07/14/2015 Speech Therapy Frequency (ACUTE ONLY) min 2x/week Treatment Duration 2 weeks      CHL IP ORAL PHASE 07/14/2015 Oral Phase Impaired Oral - Pudding Teaspoon -- Oral - Pudding Cup -- Oral - Honey Teaspoon Decreased bolus cohesion;Delayed oral transit;Premature spillage;Weak lingual manipulation;Reduced posterior propulsion Oral - Honey Cup -- Oral - Nectar Teaspoon Premature spillage;Decreased bolus cohesion;Delayed oral transit;Weak lingual manipulation;Reduced posterior propulsion Oral - Nectar Cup Weak lingual manipulation;Reduced posterior propulsion Oral - Nectar Straw Weak lingual manipulation;Reduced  posterior propulsion Oral - Thin Teaspoon Weak lingual manipulation;Premature spillage;Decreased bolus cohesion;Delayed oral transit;Reduced posterior propulsion Oral - Thin Cup -- Oral - Thin Straw -- Oral - Puree Weak lingual manipulation;Premature spillage;Decreased bolus cohesion;Delayed oral transit;Reduced posterior propulsion Oral - Mech Soft Weak lingual manipulation;Premature spillage;Decreased bolus cohesion;Delayed oral transit;Reduced posterior propulsion Oral - Regular -- Oral - Multi-Consistency -- Oral - Pill -- Oral Phase - Comment delay oral transiting with decreased oral coordination resulting in premature spillage of boluses into pharynx uncontrolled  CHL IP PHARYNGEAL PHASE 07/14/2015 Pharyngeal Phase Impaired Pharyngeal- Pudding Teaspoon -- Pharyngeal -- Pharyngeal- Pudding Cup -- Pharyngeal -- Pharyngeal- Honey Teaspoon Delayed swallow initiation-vallecula  Pharyngeal -- Pharyngeal- Honey Cup -- Pharyngeal -- Pharyngeal- Nectar Teaspoon Delayed swallow initiation-vallecula Pharyngeal -- Pharyngeal- Nectar Cup Delayed swallow initiation-vallecula;Penetration/Aspiration before swallow Pharyngeal Material enters airway, passes BELOW cords without attempt by patient to eject out (silent aspiration) Pharyngeal- Nectar Straw Delayed swallow initiation-vallecula;Penetration/Aspiration before swallow;Penetration/Apiration after swallow Pharyngeal Material enters airway, passes BELOW cords without attempt by patient to eject out (silent aspiration);Material enters airway, passes BELOW cords and not ejected out despite cough attempt by patient Pharyngeal- Thin Teaspoon Delayed swallow initiation-vallecula Pharyngeal -- Pharyngeal- Thin Cup -- Pharyngeal -- Pharyngeal- Thin Straw -- Pharyngeal -- Pharyngeal- Puree Delayed swallow initiation-vallecula Pharyngeal -- Pharyngeal- Mechanical Soft Delayed swallow initiation-vallecula Pharyngeal -- Pharyngeal- Regular -- Pharyngeal -- Pharyngeal- Multi-consistency  -- Pharyngeal -- Pharyngeal- Pill -- Pharyngeal -- Pharyngeal Comment initially pt with audible aspiration but after airway infiltrated  - pt with silent aspiration; cued coughing did not clear aspirates  CHL IP CERVICAL ESOPHAGEAL PHASE 07/14/2015 Cervical Esophageal Phase Impaired Pudding Teaspoon -- Pudding Cup -- Honey Teaspoon -- Honey Cup -- Nectar Teaspoon -- Nectar Cup -- Nectar Straw -- Thin Teaspoon -- Thin Cup -- Thin Straw -- Puree -- Mechanical Soft -- Regular -- Multi-consistency -- Pill -- Cervical Esophageal Comment appearance of mild decresed clearance distally, radiologist not present to confirm CHL IP GO 07/13/2015 Functional Assessment Tool Used skilled clinical judgement  Functional Limitations Swallowing Swallow Current Status (Y7829) CM Swallow Goal Status (F6213) CJ Swallow Discharge Status (Y8657) (None) Motor Speech Current Status (Q4696) (None) Motor Speech Goal Status (E9528) (None) Motor Speech Goal Status (U1324) (None) Spoken Language Comprehension Current Status (M0102) (None) Spoken Language Comprehension Goal Status (V2536) (None) Spoken Language Comprehension Discharge Status (U4403) (None) Spoken Language Expression Current Status (K7425) (None) Spoken Language Expression Goal Status (Z5638) (None) Spoken Language Expression Discharge Status (V5643) (None) Attention Current Status (P2951) (None) Attention Goal Status (O8416) (None) Attention Discharge Status (S0630) (None) Memory Current Status (Z6010) (None) Memory Goal Status (X3235) (None) Memory Discharge Status (T7322) (None) Voice Current Status (G2542) (None) Voice Goal Status (H0623) (None) Voice Discharge Status (J6283) (None) Other Speech-Language Pathology Functional Limitation (T5176) (None) Other Speech-Language Pathology Functional Limitation Goal Status (H6073) (None) Other Speech-Language Pathology Functional Limitation Discharge Status [phone]) (None) Donavan Burnet, MS [facility] SLP [phone]               Scheduled  Meds: . antiseptic oral rinse  7 mL Mouth Rinse BID  . aspirin  300 mg Rectal Daily  . enoxaparin (LOVENOX) injection  40 mg Subcutaneous QHS  . famotidine (PEPCID) IV  20 mg Intravenous Q24H  . metoprolol  5 mg Intravenous Q6H   Continuous Infusions: . sodium chloride 50 mL/hr at 07/13/15 1709    Principal Problem:   Acute ischemic stroke [facility]) Active Problems:   HTN (hypertension)   Dementia   HLD (hyperlipidemia)   Esophageal reflux    Time spent: 30 minutes    Vassie Loll  [facility] Pager [phone]. If 7PM-7AM, please contact night-coverage at [email or web address], password The [facility] 07/14/2015, 4:29 PM  LOS: 1 day

## 2015-07-14 NOTE — Care Management Important Message (Signed)
Important Message  Patient Details  Name: Emily Franklin MRN: [id number] Date of Birth: 1923/12/20   Medicare Important Message Given:  Yes    Adylynn Hertenstein, Stephan MinisterSusan Coleman 07/14/2015, 8:21 AM

## 2015-07-14 NOTE — Procedures (Signed)
Objective Swallowing Evaluation: Type of Study: MBS-Modified Barium Swallow Study  Patient Details  Name: Emily Franklin MRN: [id number] Date of Birth: [birth year]  Today's Date: 07/14/2015 Time: SLP Start Time (ACUTE ONLY): 0745-SLP Stop Time (ACUTE ONLY): 0820 SLP Time Calculation (min) (ACUTE ONLY): 35 min  Past Medical History:  Past Medical History  Diagnosis Date  . Closed right hip fracture [facility](HCC)     Just recently had this occur and surgical repair  . Dementia   . HTN (hypertension)   . A-fib (HCC)   . GERD (gastroesophageal reflux disease)   . SDH (subdural hematoma) [facility](HCC) April    Resolved on most recent CT   Past Surgical History:  Past Surgical History  Procedure Laterality Date  . Abdominal hysterectomy    . Hip surgery      Prior L hip surgery, recent R hip surgery for fracture   HPI: Patient is a 82 y/o female with hx of dementia, HTN, A-fib, GERD, SDH and recent hip fx presents from SNF with slurred speech, R sided upper, weakness and facial droop. MRI still pending. MBS indicated due to significant dysphagia noted at bedside.    Subjective: patient laying in bed confused but present  Assessment / Plan / Recommendation  CHL IP CLINICAL IMPRESSIONS 07/14/2015  Therapy Diagnosis Moderate oral phase dysphagia;Moderate pharyngeal phase dysphagia  Clinical Impression Moderate oropharyngeal dysphagia with sensorimotor deficits.  Decreased oral coordination resulted in premature spillage of boluses into pharynx.  Pharyngeal swallow was delayed to vallecular space - spiling over epiglottis - resulting in aspiration BEFORE the swallow with nectar straw/cup and thin.  Aspiration initially was audible but cough was ineffective to clear.  Later in study, pt with silent aspiration of even secretions.  Pt did not aspirate honey, nectar via tsp nor solids including puree/cereal bar.   Wet voice noted at end of study = but cleared with cued throat clear and swallow.  Recommend very  strict aspiration precautions and absolute supervision. Due to pt's cognitive deficits and oral deficits, SLP did not test chin tuck compensation strategies.   Impact on safety and function Risk for inadequate nutrition/hydration;Moderate aspiration risk      CHL IP TREATMENT RECOMMENDATION 07/14/2015  Treatment Recommendations Therapy as outlined in treatment plan below     Prognosis 07/14/2015  Prognosis for Safe Diet Advancement Fair  Barriers to Reach Goals Severity of deficits;Cognitive deficits  Barriers/Prognosis Comment --    CHL IP DIET RECOMMENDATION 07/14/2015  SLP Diet Recommendations Dysphagia 1 (Puree) solids;Nectar thick liquid  Liquid Administration via Spoon  Medication Administration Crushed with puree  Compensations Minimize environmental distractions;Slow rate;Small sips/bites;Other (Comment)  Postural Changes Seated upright at 90 degrees;Remain semi-upright after after feeds/meals (Comment)      CHL IP OTHER RECOMMENDATIONS 07/14/2015  Recommended Consults --  Oral Care Recommendations Oral care before and after PO  Other Recommendations Have oral suction available      CHL IP FOLLOW UP RECOMMENDATIONS 07/14/2015  Follow up Recommendations [facility]illed Nursing [facility] D[facility] 07/14/2015  Speech Therapy Frequency (ACUTE ONLY) min 2x/week  Treatment Duration 2 weeks           CHL IP ORAL PHASE 07/14/2015  Oral Phase Impaired  Oral - Pudding Teaspoon --  Oral - Pudding Cup --  Oral - Honey Teaspoon Decreased bolus cohesion;Delayed oral transit;Premature spillage;Weak lingual manipulation;Reduced posterior propulsion  Oral - Honey Cup --  Oral - Nectar Teaspoon Premature spillage;Decreased bolus cohesion;Delayed oral  transit;Weak lingual manipulation;Reduced posterior propulsion  Oral - Nectar Cup Weak lingual manipulation;Reduced posterior propulsion  Oral - Nectar Straw Weak lingual manipulation;Reduced posterior propulsion  Oral -  Thin Teaspoon Weak lingual manipulation;Premature spillage;Decreased bolus cohesion;Delayed oral transit;Reduced posterior propulsion  Oral - Thin Cup --  Oral - Thin Straw --  Oral - Puree Weak lingual manipulation;Premature spillage;Decreased bolus cohesion;Delayed oral transit;Reduced posterior propulsion  Oral - Mech Soft Weak lingual manipulation;Premature spillage;Decreased bolus cohesion;Delayed oral transit;Reduced posterior propulsion  Oral - Regular --  Oral - Multi-Consistency --  Oral - Pill --  Oral Phase - Comment delay oral transiting with decreased oral coordination resulting in premature spillage of boluses into pharynx uncontrolled    CHL IP PHARYNGEAL PHASE 07/14/2015  Pharyngeal Phase Impaired  Pharyngeal- Pudding Teaspoon --  Pharyngeal --  Pharyngeal- Pudding Cup --  Pharyngeal --  Pharyngeal- Honey Teaspoon Delayed swallow initiation-vallecula  Pharyngeal --  Pharyngeal- Honey Cup --  Pharyngeal --  Pharyngeal- Nectar Teaspoon Delayed swallow initiation-vallecula  Pharyngeal --  Pharyngeal- Nectar Cup Delayed swallow initiation-vallecula;Penetration/Aspiration before swallow  Pharyngeal Material enters airway, passes BELOW cords without attempt by patient to eject out (silent aspiration)  Pharyngeal- Nectar Straw Delayed swallow initiation-vallecula;Penetration/Aspiration before swallow;Penetration/Apiration after swallow  Pharyngeal Material enters airway, passes BELOW cords without attempt by patient to eject out (silent aspiration);Material enters airway, passes BELOW cords and not ejected out despite cough attempt by patient  Pharyngeal- Thin Teaspoon Delayed swallow initiation-vallecula  Pharyngeal --  Pharyngeal- Thin Cup --  Pharyngeal --  Pharyngeal- Thin Straw --  Pharyngeal --  Pharyngeal- Puree Delayed swallow initiation-vallecula  Pharyngeal --  Pharyngeal- Mechanical Soft Delayed swallow initiation-vallecula  Pharyngeal --  Pharyngeal- Regular --   Pharyngeal --  Pharyngeal- Multi-consistency --  Pharyngeal --  Pharyngeal- Pill --  Pharyngeal --  Pharyngeal Comment initially pt with audible aspiration but after airway infiltrated  - pt with silent aspiration; cued coughing did not clear aspirates     CHL IP CERVICAL ESOPHAGEAL PHASE 07/14/2015  Cervical Esophageal Phase Impaired  Pudding Teaspoon --  Pudding Cup --  Honey Teaspoon --  Honey Cup --  Nectar Teaspoon --  Nectar Cup --  Nectar Straw --  Thin Teaspoon --  Thin Cup --  Thin Straw --  Puree --  Mechanical Soft --  Regular --  Multi-consistency --  Pill --  Cervical Esophageal Comment appearance of mild decresed clearance distally, radiologist not present to confirm    CHL IP GO 07/13/2015  Functional Assessment Tool Used skilled clinical judgement   Functional Limitations Swallowing  Swallow Current Status (U9811) CM  Swallow Goal Status (B1478) CJ  Swallow Discharge Status (G9562) (None)  Motor Speech Current Status (Z3086) (None)  Motor Speech Goal Status (V7846) (None)  Motor Speech Goal Status (N6295) (None)  Spoken Language Comprehension Current Status (M8413) (None)  Spoken Language Comprehension Goal Status (K4401) (None)  Spoken Language Comprehension Discharge Status (U2725) (None)  Spoken Language Expression Current Status (D6644) (None)  Spoken Language Expression Goal Status (I3474) (None)  Spoken Language Expression Discharge Status (Q5956) (None)  Attention Current Status (L8756) (None)  Attention Goal Status (E3329) (None)  Attention Discharge Status (J1884) (None)  Memory Current Status (Z6606) (None)  Memory Goal Status (T0160) (None)  Memory Discharge Status (F0932) (None)  Voice Current Status (T5573) (None)  Voice Goal Status (U2025) (None)  Voice Discharge Status (K2706) (None)  Other Speech-Language Pathology Functional Limitation (C3762) (None)  Other Speech-Language Pathology Functional Limitation Goal Status (G3151) (None)  Other Speech-Language Pathology Functional Limitation Discharge Status [phone]) (None)    Mickie Bail [place], MS [facility] SLP [phone]

## 2015-07-14 NOTE — Discharge Instructions (Addendum)
Recommendations from speech therapy: Diet recommendations: Dysphagia 1 (puree);Honey-thick liquid Liquids provided via: Teaspoon Medication Administration: Whole meds with puree Supervision: Full supervision/cueing for compensatory strategies Compensations: Small sips/bites;Slow rate;Chin tuck Postural Changes and/or Swallow Maneuvers: Seated upright 90 degrees;Upright 30-60 min after meal    Information on my medicine - ELIQUIS (apixaban)  This medication education was reviewed with me or my healthcare representative as part of my discharge preparation.   Why was Eliquis prescribed for you? Eliquis was prescribed for you to reduce the risk of a blood clot forming that can cause a stroke if you have a medical condition called atrial fibrillation (a type of irregular heartbeat).  What do You need to know about Eliquis ? Take your Eliquis TWICE DAILY - one tablet in the morning and one tablet in the evening with or without food. If you have difficulty swallowing the tablet whole please discuss with your pharmacist how to take the medication safely.  Take Eliquis exactly as prescribed by your doctor and DO NOT stop taking Eliquis without talking to the doctor who prescribed the medication.  Stopping may increase your risk of developing a stroke.  Refill your prescription before you run out.  After discharge, you should have regular check-up appointments with your healthcare provider that is prescribing your Eliquis.  In the future your dose may need to be changed if your kidney function or weight changes by a significant amount or as you get older.  What do you do if you miss a dose? If you miss a dose, take it as soon as you remember on the same day and resume taking twice daily.  Do not take more than one dose of ELIQUIS at the same time to make up a missed dose.  Important Safety Information A possible side effect of Eliquis is bleeding. You should call your healthcare provider  right away if you experience any of the following: ? Bleeding from an injury or your nose that does not stop. ? Unusual colored urine (red or dark brown) or unusual colored stools (red or black). ? Unusual bruising for unknown reasons. ? A serious fall or if you hit your head (even if there is no bleeding).  Some medicines may interact with Eliquis and might increase your risk of bleeding or clotting while on Eliquis. To help avoid this, consult your healthcare provider or pharmacist prior to using any new prescription or non-prescription medications, including herbals, vitamins, non-steroidal anti-inflammatory drugs (NSAIDs) and supplements.  This website has more information on Eliquis (apixaban): http://www.eliquis.com/eliquis/home

## 2015-07-14 NOTE — Clinical Social Work Note (Signed)
Verbal handoff received from 5W CSW. [facility] and plans to return once medically stable for d/c.   CSW will continue to follow patient and pt's family for continued support and to facilitate pt's d/c needs once medically stable.   Derenda FennelBashira Sadi Arave, MSW, LCSWA [phone] 07/14/2015 2:08 PM

## 2015-07-14 NOTE — Progress Notes (Signed)
STROKE TEAM PROGRESS NOTE   SUBJECTIVE (INTERVAL HISTORY) No family at bedside. She had MRI showed left pontine stroke. EKG also showed afib/aflutter with RVR. Passed swallow, will put back home metoprolol.    OBJECTIVE Temp:  [98.2 F (36.8 C)-100 F (37.8 C)] 99.5 F (37.5 C) (06/20 1414) Pulse Rate:  [71-146] 75 (06/20 1414) Cardiac Rhythm:  [-] Normal sinus rhythm (06/20 0700) Resp:  [20] 20 (06/20 1414) BP: (124-161)/(60-94) 147/60 mmHg (06/20 1414) SpO2:  [97 %-100 %] 100 % (06/20 1414)  CBC:   Recent Labs Lab 07/13/15 0658 07/14/15 0628  WBC 8.7 9.9  HGB 11.4* 11.5*  HCT 35.5* 35.3*  MCV 88.5 88.5  PLT 267 253    Basic Metabolic Panel:   Recent Labs Lab 07/13/15 0658 07/14/15 0628  NA 135 137  K 3.4* 3.3*  CL 101 103  CO2 23 22  GLUCOSE 99 109*  BUN 11 11  CREATININE 0.89 0.89  CALCIUM 10.2 9.6    Lipid Panel:     Component Value Date/Time   CHOL 188 07/13/2015 0658   TRIG 173* 07/13/2015 0658   HDL 30* 07/13/2015 0658   CHOLHDL 6.3 07/13/2015 0658   VLDL 35 07/13/2015 0658   LDLCALC 123* 07/13/2015 0658   HgbA1c:  Lab Results  Component Value Date   HGBA1C 5.4 07/13/2015   Urine Drug Screen: No results found for: LABOPIA, COCAINSCRNUR, LABBENZ, AMPHETMU, THCU, LABBARB    IMAGING I have personally reviewed the radiological images below and agree with the radiology interpretations.  CT HEAD  07/13/2015   No intracranial hemorrhage. Significant white matter changes limits evaluation for detection of acute infarct. No large acute infarct detected. No intracranial enhancing lesion. Global atrophy without hydrocephalus.   CTA NECK  07/13/2015   Mild irregularity and narrowing without high-grade stenosis involving either common carotid artery, either internal carotid artery or either vertebral artery. Degenerative changes cervical spine most prominent C5-6. Scattered Schmorl's node deformities with mild loss of height superior endplate T2 on T3.  Scoliosis cervical and thoracic spine. Heterogeneous thyroid gland with largest nodule right lobe measuring up to 2 cm. This can be assessed/ followed with thyroid ultrasound.   CTA HEAD Calcified cavernous segment internal carotid artery with mild narrowing bilaterally. Right internal carotid artery supraclinoid segment calcification with mild to moderate narrowing. Mild narrowing and irregularity M1 segment middle cerebral artery bilaterally. M2/M3 branch vessel moderate narrowing bilaterally. Moderate narrowing A1 segment and A2 segment left anterior cerebral artery with mild narrowing right anterior cerebral artery A1 segment. Mild narrowing distal vertebral arteries bilaterally. Markedly ectatic basilar artery. Marked tandem stenosis posterior cerebral artery bilaterally involving proximal mid and distal aspect bilaterally.   Mr Brain Wo Contrast 07/14/2015  IMPRESSION: 1. Acute infarct in the left pons. No associated hemorrhage or mass effect. 2. No other acute intracranial abnormality. Chronic signal changes in the cerebral white matter and deep gray matter most compatible with chronic small vessel disease.   2D echo - - Left ventricle: The cavity size was normal. There was mild  concentric hypertrophy. Systolic function was normal. The  estimated ejection fraction was in the range of 55% to 60%. Wall  motion was normal; there were no regional wall motion  abnormalities. Doppler parameters are consistent with abnormal  left ventricular relaxation (grade 1 diastolic dysfunction).  Doppler parameters are consistent with elevated ventricular  end-diastolic filling pressure. - Aortic valve: Transvalvular velocity was within the normal range.  There was no stenosis. There was no regurgitation. -  Mitral valve: Moderately calcified posterior annulus.  Transvalvular velocity was within the normal range. There was no  evidence for stenosis. There was mild regurgitation. - Left atrium: The  atrium was moderately dilated. - Right ventricle: The cavity size was normal. Wall thickness was  normal. Systolic function was normal. - Tricuspid valve: There was mild regurgitation. - Pulmonary arteries: Systolic pressure was mildly to moderately  increased. PA peak pressure: 45 mm Hg (S).   EKG - Afib/Aflutter with RVR.    PHYSICAL EXAM  Temp:  [98.2 F (36.8 C)-100 F (37.8 C)] 99.5 F (37.5 C) (06/20 1414) Pulse Rate:  [71-146] 75 (06/20 1414) Resp:  [20] 20 (06/20 1414) BP: (124-161)/(60-94) 147/60 mmHg (06/20 1414) SpO2:  [97 %-100 %] 100 % (06/20 1414)  General - Well nourished, well developed, in no apparent distress.  Ophthalmologic - Fundi not visualized due to noncooperation.  Cardiovascular - irregularly irregular heart rate and rhythm.  Mental Status -  Awake alert, not able to answer orientation questions. No neglect. Able to follow some simple commands. Speech output not able to be understood. No cooperative for naming or repeating. Mild to moderate dysarthria.  Cranial Nerves II - XII - II - blinking to visual threat bilaterally. III, IV, VI - Extraocular movements intact, no eyes gaze preference. V - Facial sensation intact bilaterally. VII - right facial droop. VIII - Hearing & vestibular intact bilaterally. X - Palate elevates symmetrically. XI - Chin turning & shoulder shrug intact bilaterally. XII - Tongue protrusion intact.  Motor Strength - The patient's strength was 5-/5 LUE, 0/5 RUE, 2/5 BLEs.  Bulk was normal and fasciculations were absent.   Motor Tone - Muscle tone was assessed at the neck and appendages and was normal.  Reflexes - The patient's reflexes were 1+ in all extremities and she had no pathological reflexes.  Sensory - not cooperative.    Coordination - not cooperative.  Tremor was absent.  Gait and Station - not tested.   ASSESSMENT/PLAN Ms. Emily Franklin is a [age] y.o. female with history of hypertension, dementia,  recent history of falls resulting in right hip fracture s/p surgery found to have dense R hemiparesis at her nursing home where she was undergoing rehab. She did not receive IV t-PA due to unclear LKW.   Stroke: likely left brain infarct, embolic secondary to new diagnosed atrial fibrillation not [facility]  Resultant  Right facial droop and right UE flaccid  MRI  Left pontine infarct  CT head  Atrophy. Ex vacuo hydrocephalus  CTA head  Diffuse mild atherosclerosis  CTA neck  No high-grade stenosis  2D Echo EF 55-60%  EKG showed afib/aflutter with RVR  LDL 123  HgbA1c 5.4  Lovenox 40 mg sq daily for VTE prophylaxis DIET - DYS 1 Room service appropriate?: Yes; Fluid consistency:: Nectar Thick  aspirin 325 mg daily prior to admission. Due to afib/aflutter, we recommended eliquis for anticoagulation for stroke prevention. Eliquis pharmacy consultation placed.   Ongoing aggressive stroke risk factor management  Therapy recommendations:  SNF   Disposition:  pending  ([facility]  Atrial Fibrillation/Aflutter  Home anticoagulation:  none   Daughter and sister were not aware of the diagnosis  EKF and tele showed afib/aflutter with RVR  Recommend DOAC with eliquis for stroke prevention. Pharmacy consulted    Hypertension  Stable Permissive hypertension (OK if < 220/120) but gradually normalize in 5-7 days Long-term BP goal normotensive  Hyperlipidemia  Home meds:  No statin listed  LDL 123, goal < 70  Pravastatin 20mg  added.  Continue statin at discharge  Other Stroke Risk Factors  Advanced age  Family hx stroke (Maternal Grandmother)  Other Active Problems  Baseline dementia  GERD  Hx hip fx following fall in April 2017  Dysphagia - passed swallow on diet  Hospital day # 1  Neurology will sign off. Please call with questions. Pt will follow up with Darrol Angel NP at [facility] in about 2 months. Thanks for the consult.   Marvel Plan,  MD PhD Stroke Neurology 07/14/2015 5:12 PM   To contact Stroke Continuity provider, please refer to [email or web address]. After hours, contact General Neurology

## 2015-07-14 NOTE — Progress Notes (Addendum)
ANTICOAGULATION CONSULT NOTE - Initial Consult  Pharmacy Consult for Eliquis Indication: atrial fibrillation  Allergies  Allergen Reactions  . Codeine     unknown    Patient Measurements: Height: 5\' 3"  (160 cm) (per. family ) Weight: 141 lb 14.4 oz (64.365 kg) IBW/kg (Calculated) : 52.4   Vital Signs: Temp: 99.5 F (37.5 C) (06/20 1414) Temp Source: Oral (06/20 1414) BP: 147/60 mmHg (06/20 1414) Pulse Rate: 75 (06/20 1414)  Labs:  Recent Labs  07/13/15 0658 07/14/15 0628  HGB 11.4* 11.5*  HCT 35.5* 35.3*  PLT 267 253  CREATININE 0.89 0.89    Estimated Creatinine Clearance: 38.7 mL/min (by C-G formula based on Cr of 0.89).  Assessment: [age] YOF here with stroke like symptoms, newly found AFib to start Eliquis.  Age: 82, wt: 64kg, SCr: 0.89. She technically does not meet 2 requirements for dose reduction, however she had a fall earlier this year which resulted in SDH and hip fracture.  Hgb 11.5, plts 253- no bleeding noted.  Goal of Therapy:    Monitor platelets by anticoagulation protocol: Yes   Plan:  -discontinue prophylactic Lovenox and start Eliquis 2.5mg  BID tonight- lower dose d/t concern for fall risk and borderline weight with advanced age -pharmacy to sign off and follow peripherally  Tishina Lown D. Hanford Lust, PharmD, BCPS Clinical Pharmacist Pager: [phone] 07/14/2015 5:19 PM

## 2015-07-15 DIAGNOSIS — R1310 Dysphagia, unspecified: Secondary | ICD-10-CM

## 2015-07-15 DIAGNOSIS — I480 Paroxysmal atrial fibrillation: Secondary | ICD-10-CM

## 2015-07-15 LAB — BASIC METABOLIC PANEL
Anion gap: 12 (ref 5–15)
BUN: 14 mg/dL (ref 6–20)
CHLORIDE: 104 mmol/L (ref 101–111)
CO2: 23 mmol/L (ref 22–32)
CREATININE: 0.87 mg/dL (ref 0.44–1.00)
Calcium: 9.8 mg/dL (ref 8.9–10.3)
GFR calc Af Amer: >60 mL/min (ref >60)
GFR calc non Af Amer: 57 mL/min — ABNORMAL LOW (ref >60)
GLUCOSE: 121 mg/dL — AB (ref 65–99)
Potassium: 3.1 mmol/L — ABNORMAL LOW (ref 3.5–5.1)
SODIUM: 139 mmol/L (ref 135–145)

## 2015-07-15 LAB — CBC
HEMATOCRIT: 34.7 % — AB (ref 36.0–46.0)
Hemoglobin: 11.3 g/dL — ABNORMAL LOW (ref 12.0–15.0)
MCH: 28.5 pg (ref 26.0–34.0)
MCHC: 32.6 g/dL (ref 30.0–36.0)
MCV: 87.4 fL (ref 78.0–100.0)
Platelets: 237 10*3/uL (ref 150–400)
RBC: 3.97 MIL/uL (ref 3.87–5.11)
RDW: 13 % (ref 11.5–15.5)
WBC: 10.8 10*3/uL — ABNORMAL HIGH (ref 4.0–10.5)

## 2015-07-15 MED ORDER — APIXABAN 2.5 MG PO TABS: 2.5000 mg | ORAL_TABLET | Freq: Two times a day (BID) | ORAL | Status: AC

## 2015-07-15 MED ORDER — FOOD THICKENER (THICKENUP CLEAR): ORAL | Status: AC

## 2015-07-15 MED ORDER — PRAVASTATIN SODIUM 20 MG PO TABS: 20.0000 mg | ORAL_TABLET | Freq: Every day | ORAL | Status: AC

## 2015-07-15 NOTE — Progress Notes (Signed)
Patient is discharged from room 5C08 at this time. Alert and in stable condition. IV site d/c'd as well as tele. Report given to receiving nurse[facility] with all questions answered. Left unit via stretcher by PTAR with all belongings at side.

## 2015-07-15 NOTE — Clinical Social Work Note (Signed)
Patient's dtr, Tommie requesting medical updates from MD. CSW has paged MD with this information.   Clinical Social Worker facilitated patient discharge including contacting patient family and facility to confirm patient discharge plans.  Clinical information faxed to facility and family agreeable with plan.  CSW arranged [facility]  RN to call report prior to discharge.  Clinical Social Worker will sign off for now as social work intervention is no longer needed. Please consult us again if new need arises.  Derenda FennelBashira Reyanne Hussar, MSW, LCSWA [phone] 07/15/2015 3:31 PM

## 2015-07-15 NOTE — Discharge Summary (Addendum)
Physician Discharge Summary  Emily Franklin GYI:[id number] DOB: 1923/04/22 DOA: 07/12/2015  PCP: No PCP Per Patient  Admit date: 07/12/2015 Discharge date: 07/15/2015  Time spent: 35 minutes  Recommendations for Outpatient Follow-up:  -Repeat BMET to follow electrolytes and renal function -Long term prognosis overall is not good and will recommend palliative follow up for further discussion of GOC and advance directives. If patient failed to improved, further decline or develop aspiration PNA; recommendations are to transition to comfort care and prevent further hospitalizations.   Discharge Diagnoses:  Principal Problem:   Acute ischemic stroke (HCC) Active Problems:   HTN (hypertension)   Dementia   HLD (hyperlipidemia)   Esophageal reflux   Dysphagia      Discharge Condition: stable. Will discharge back to SNF for further care and rehab.  Diet recommendation: dysphagia 1 with honey thick liquids (heart healthy)  Filed Weights   07/12/15 2232  Weight: 64.365 kg (141 lb 14.4 oz)    History of present illness:  82 y.o. female with medical history significant of HTN, dementia, falls occuring this year resulting in SDH in April and a R hip fracture in April. Patient was in SNF following hip fracture, was actually not doing too well at baseline (with PT could take about 3 steps assisted and that's all). This morning daughter went to see patient in NH and noticed slurred speech, R sided upper, lower, weakness, facial droop. Per RN at [facility] patient was normal at breakfast time and ate breakfast, but even that isnt very clear. What is clear is that it is now 11:30 at night and patient is well out of any interventional window (>12 hours since LKW).  Complete stroke work up. Will need SNF at discharge. Patient was evaluated by speech therapy and the recommendation was for dysphagia 1 diet with crushed medications. At this moment will allow 24 hours to assess patient swallowing capacity  with adjusted diet before transitioning medications to by mouth.  Hospital Course:  1-Acute left [place] ischemic stroke: with residual right facial droop and right hemiparesis (affecting arm > leg) -will discharge to SNF for further care and rehab -echo w/o source for acute emboli, conserved EF and w/o wall motion abnormalities  -patient found to be on A. Fib (newly found); this could be reason for stroke. Will use Eliquis for secondary prevention. -LDL 123; discharge on pravachol -continue PT/OT and SPL rehab at SNF -long term prognosis overall is not good and will recommend palliative follow up for further discussion of GOC and advance directives. If patient failed to improved, further decline or develop aspiration PNA; recommendations are to transition to comfort care and prevent further hospitalizations.  2-A. Fib, Paroxysmal -new diagnosis -CHADsVASC score 5-6 -will continue the use of metoprolol for rate control and has been started on eliquis for secondary prevention  -2-D: with preserved EF, no wall motion abnormalities   3-HLD -statins when able to take PO -LDL 123  4-HTN: -stable -will be permissive with hypertension currently  5-Dementia: -continue supportive care  6-GERD: -continue home PPI when able to take PO's -will use pepcid IV now  7-Dysphagia: residual deficit from stroke. -underlying dementia, most likely contributing and making things more challenging  -will need speech therapy follow at SNF -Diet recommendations: Dysphagia 1 (puree);Honey-thick liquid Liquids provided via: Teaspoon Medication Administration: Whole meds with puree Supervision: Full supervision/cueing for compensatory strategies Compensations: Small sips/bites;Slow rate;Chin tuck Postural Changes and/or Swallow Maneuvers: Seated upright 90 degrees;Upright 30-60 min after meal  Procedures:  MRI:  positive left pontine ischemic stroke  CTA head and neck: diffuse mild atherosclerosis  and no signs of high stenosis   2-D echo: preserved EF (55-60%), no wall motion abnormalities and not evidence for acute emboli source   Consultations:  Neurology   Discharge Exam: Filed Vitals:   07/15/15 0947 07/15/15 1002  BP: 141/86 110/75  Pulse: 66 101  Temp: 97.2 F (36.2 C) 99.4 F (37.4 C)  Resp: 16 16    General: Pleasantly confused and oriented only to person. Able to follow simple commands intermittently, but require significant cueing. No chest pain, no shortness of breath. Patient is afebrile  Cardiovascular: irregular, no rubs or gallops, no JVD  Respiratory: CTA bilaterally   Abdomen: soft, NT, ND, positive BS  Musculoskeletal: no cyanosis or edema  Neurology exam: positive right facial droop, dense right side hemiparesis/neck flexion and positive slurred speech (even improved some from description on admission).   Discharge Instructions   Discharge Instructions    Ambulatory referral to Neurology    Complete by:  As directed   [facility]. Thanks.     Diet - low sodium heart healthy    Complete by:  As directed      Discharge instructions    Complete by:  As directed   Maintain adequate hydration Please palliative care to follow patient at facility and readdress goals of care and advance directive Dysphagia 1 with honey thick liquids (see discharge instructions for further recommendations by SPL) Rehabilitation by PT/OT/SPL as per SNF protocol          Current Discharge Medication List    START taking these medications   Details  apixaban (ELIQUIS) 2.5 MG TABS tablet Take 1 tablet (2.5 mg total) by mouth 2 (two) times daily.    food thickener (RESOURCE THICKENUP CLEAR) POWD Use as needed to achieve honey thick consistency on liquids    pravastatin (PRAVACHOL) 20 MG tablet Take 1 tablet (20 mg total) by mouth daily at 6 PM.      CONTINUE these medications which have NOT CHANGED   Details   acetaminophen (TYLENOL) 325 MG tablet Take 650 mg by mouth 4 (four) times daily.    Cholecalciferol (VITAMIN D3) 2000 units TABS Take 1 tablet by mouth daily.    Cyanocobalamin (VITAMIN B-12) 2500 MCG SUBL Place 1 tablet under the tongue daily.    guaifenesin (ROBITUSSIN) 100 MG/5ML syrup Take 200 mg by mouth every 4 (four) hours as needed for cough.    lansoprazole (PREVACID) 30 MG capsule Take 30 mg by mouth daily at 12 noon.    losartan-hydrochlorothiazide (HYZAAR) 100-25 MG tablet Take 1 tablet by mouth daily.    Magnesium Hydroxide (MILK OF MAGNESIA PO) Take 30 mLs by mouth daily as needed. Medication: Milk of Magnesia suspension 100-25mg .  For constipation    memantine (NAMENDA) 10 MG tablet Take 10 mg by mouth 2 (two) times daily.    metoprolol (LOPRESSOR) 50 MG tablet Take 50 mg by mouth daily.    Multiple Vitamin (MULTIVITAMIN WITH MINERALS) TABS tablet Take 1 tablet by mouth daily.    PARoxetine (PAXIL) 20 MG tablet Take 20 mg by mouth at bedtime.    polyethylene glycol (MIRALAX / GLYCOLAX) packet Take 17 g by mouth every other day.    sennosides-docusate sodium (SENOKOT-S) 8.6-50 MG tablet Take 2 tablets by mouth at bedtime.      STOP taking these medications  ALPRAZolam (XANAX) 0.25 MG tablet      calcium-vitamin D (OSCAL WITH D) 500-200 MG-UNIT tablet      PRESCRIPTION MEDICATION        Allergies  Allergen Reactions  . Codeine     unknown   Follow-up Information    Follow up with Nilda Riggs, NP. Schedule an appointment as soon as possible for a visit in 2 months.   Specialty:  Family Medicine   Why:  stroke clinic   Contact information:   [address] [place] [address] [phone]       The results of significant diagnostics from this hospitalization (including imaging, microbiology, ancillary and laboratory) are listed below for reference.    Significant Diagnostic Studies: Ct Angio Head W Or Wo  Contrast  07/13/2015  CLINICAL DATA:  82 year old hypertensive female with history of subdural hematoma and dementia presenting with dense right hemiparesis and aphasia. Subsequent encounter. EXAM: CT ANGIOGRAPHY HEAD AND NECK TECHNIQUE: Multidetector CT imaging of the head and neck was performed using the standard protocol during bolus administration of intravenous contrast. Multiplanar CT image reconstructions and MIPs were obtained to evaluate the vascular anatomy. Carotid stenosis measurements (when applicable) are obtained utilizing NASCET criteria, using the distal internal carotid diameter as the denominator. CONTRAST:  50 cc Isovue 370. COMPARISON:  07/12/2015 and 05/20/2015 head CT. FINDINGS: CT HEAD Brain: No intracranial hemorrhage. Significant white matter changes limited evaluation for detection of acute infarct. No large acute infarct detected. No intracranial enhancing lesion. Global atrophy without hydrocephalus. Calvarium and skull base: Negative. Paranasal sinuses: Opacification diminutive size right sphenoid sinus. Orbits: Post lens replacement.  Probable calcification bilaterally. CTA NECK Aortic arch: Ectatic with atherosclerotic changes. Right carotid system: Mild irregularity and narrowing without high-grade stenosis involving the right common carotid artery or right internal carotid artery. Moderate narrowing origin right external carotid artery. Left carotid system: Irregularity with slight narrowing left common carotid artery and internal carotid artery without significant narrowing. Vertebral arteries:Slight irregularity without high-grade stenosis. Skeleton: Degenerative changes cervical spine most prominent C5-6. Scattered Schmorl's node deformities with mild loss of height superior endplate T2 on T3. Scoliosis cervical and thoracic spine. Other neck: Heterogeneous thyroid gland with largest nodule right lobe measuring up to 2 cm. This can be assessed/ followed with thyroid ultrasound.  No worrisome lung apical mass. CTA HEAD Anterior circulation: Calcified cavernous segment internal carotid artery with mild narrowing bilaterally. Right internal carotid artery supraclinoid segment calcification with mild moderate narrowing. Mild narrowing and irregularity M1 segment middle cerebral artery bilaterally. M2/M3 branch vessel moderate narrowing bilaterally. Moderate narrowing A1 segment and A2 segment left anterior cerebral artery with mild narrowing right anterior cerebral artery A1 segment. Posterior circulation: Mild narrowing distal vertebral arteries bilaterally. Markedly ectatic basilar artery. Marked tandem stenosis posterior cerebral artery bilaterally involving proximal mid and distal aspect bilaterally. Venous sinuses: Patent. Anatomic variants: Negative Delayed phase: As above. IMPRESSION: CT HEAD No intracranial hemorrhage. Significant white matter changes limits evaluation for detection of acute infarct. No large acute infarct detected. No intracranial enhancing lesion. Global atrophy without hydrocephalus. CTA NECK Mild irregularity and narrowing without high-grade stenosis involving either common carotid artery, either internal carotid artery or either vertebral artery. Degenerative changes cervical spine most prominent C5-6. Scattered Schmorl's node deformities with mild loss of height superior endplate T2 on T3. Scoliosis cervical and thoracic spine. Heterogeneous thyroid gland with largest nodule right lobe measuring up to 2 cm. This can be assessed/ followed with thyroid ultrasound. CTA HEAD Calcified  cavernous segment internal carotid artery with mild narrowing bilaterally. Right internal carotid artery supraclinoid segment calcification with mild to moderate narrowing. Mild narrowing and irregularity M1 segment middle cerebral artery bilaterally. M2/M3 branch vessel moderate narrowing bilaterally. Moderate narrowing A1 segment and A2 segment left anterior cerebral artery with mild  narrowing right anterior cerebral artery A1 segment. Mild narrowing distal vertebral arteries bilaterally. Markedly ectatic basilar artery. Marked tandem stenosis posterior cerebral artery bilaterally involving proximal mid and distal aspect bilaterally. Electronically Signed   By: Lacy Duverney M.D.   On: 07/13/2015 11:35   Dg Chest 2 View  07/13/2015  CLINICAL DATA:  Stroke EXAM: CHEST  2 VIEW COMPARISON:  05/15/2015 FINDINGS: Stable widened appearance of mediastinum much of which is due to vascular tortuosity. The patient is rotated to the right on today's radiograph, reducing diagnostic sensitivity and specificity. Bony demineralization noted. Blunting of the left lateral costophrenic angle. Mild enlargement of the cardiopericardial silhouette. Several compression fractures are noted in the thoracic spine, 1 in the mid to lower thoracic spine on the lateral projection with about 50% loss of height. There is also a mid lumbar compression fracture. Deformity of the left proximal humerus noted, healed. IMPRESSION: 1. Prominent mediastinum ascribed to tortuous vasculature. Mild enlargement of the cardiopericardial silhouette. 2. Bony demineralization with several thoracic and lumbar compression fractures, which may well be old. 3. Trace left pleural effusion, with blunting of the left lateral costophrenic angle. Electronically Signed   By: Gaylyn Rong M.D.   On: 07/13/2015 18:38   Ct Angio Neck W Or Wo Contrast  07/13/2015  CLINICAL DATA:  82 year old hypertensive female with history of subdural hematoma and dementia presenting with dense right hemiparesis and aphasia. Subsequent encounter. EXAM: CT ANGIOGRAPHY HEAD AND NECK TECHNIQUE: Multidetector CT imaging of the head and neck was performed using the standard protocol during bolus administration of intravenous contrast. Multiplanar CT image reconstructions and MIPs were obtained to evaluate the vascular anatomy. Carotid stenosis measurements (when  applicable) are obtained utilizing NASCET criteria, using the distal internal carotid diameter as the denominator. CONTRAST:  50 cc Isovue 370. COMPARISON:  07/12/2015 and 05/20/2015 head CT. FINDINGS: CT HEAD Brain: No intracranial hemorrhage. Significant white matter changes limited evaluation for detection of acute infarct. No large acute infarct detected. No intracranial enhancing lesion. Global atrophy without hydrocephalus. Calvarium and skull base: Negative. Paranasal sinuses: Opacification diminutive size right sphenoid sinus. Orbits: Post lens replacement.  Probable calcification bilaterally. CTA NECK Aortic arch: Ectatic with atherosclerotic changes. Right carotid system: Mild irregularity and narrowing without high-grade stenosis involving the right common carotid artery or right internal carotid artery. Moderate narrowing origin right external carotid artery. Left carotid system: Irregularity with slight narrowing left common carotid artery and internal carotid artery without significant narrowing. Vertebral arteries:Slight irregularity without high-grade stenosis. Skeleton: Degenerative changes cervical spine most prominent C5-6. Scattered Schmorl's node deformities with mild loss of height superior endplate T2 on T3. Scoliosis cervical and thoracic spine. Other neck: Heterogeneous thyroid gland with largest nodule right lobe measuring up to 2 cm. This can be assessed/ followed with thyroid ultrasound. No worrisome lung apical mass. CTA HEAD Anterior circulation: Calcified cavernous segment internal carotid artery with mild narrowing bilaterally. Right internal carotid artery supraclinoid segment calcification with mild moderate narrowing. Mild narrowing and irregularity M1 segment middle cerebral artery bilaterally. M2/M3 branch vessel moderate narrowing bilaterally. Moderate narrowing A1 segment and A2 segment left anterior cerebral artery with mild narrowing right anterior cerebral artery A1 segment.  Posterior circulation: Mild  narrowing distal vertebral arteries bilaterally. Markedly ectatic basilar artery. Marked tandem stenosis posterior cerebral artery bilaterally involving proximal mid and distal aspect bilaterally. Venous sinuses: Patent. Anatomic variants: Negative Delayed phase: As above. IMPRESSION: CT HEAD No intracranial hemorrhage. Significant white matter changes limits evaluation for detection of acute infarct. No large acute infarct detected. No intracranial enhancing lesion. Global atrophy without hydrocephalus. CTA NECK Mild irregularity and narrowing without high-grade stenosis involving either common carotid artery, either internal carotid artery or either vertebral artery. Degenerative changes cervical spine most prominent C5-6. Scattered Schmorl's node deformities with mild loss of height superior endplate T2 on T3. Scoliosis cervical and thoracic spine. Heterogeneous thyroid gland with largest nodule right lobe measuring up to 2 cm. This can be assessed/ followed with thyroid ultrasound. CTA HEAD Calcified cavernous segment internal carotid artery with mild narrowing bilaterally. Right internal carotid artery supraclinoid segment calcification with mild to moderate narrowing. Mild narrowing and irregularity M1 segment middle cerebral artery bilaterally. M2/M3 branch vessel moderate narrowing bilaterally. Moderate narrowing A1 segment and A2 segment left anterior cerebral artery with mild narrowing right anterior cerebral artery A1 segment. Mild narrowing distal vertebral arteries bilaterally. Markedly ectatic basilar artery. Marked tandem stenosis posterior cerebral artery bilaterally involving proximal mid and distal aspect bilaterally. Electronically Signed   By: Lacy Duverney M.D.   On: 07/13/2015 11:35   Mr Brain Wo Contrast  07/14/2015  CLINICAL DATA:  82 year old female with right side severe weakness and aphasia. Initial encounter. EXAM: MRI HEAD WITHOUT CONTRAST TECHNIQUE:  Multiplanar, multiecho pulse sequences of the brain and surrounding structures were obtained without intravenous contrast. COMPARISON:  CTA head and neck 07/13/2015 and earlier. FINDINGS: Study is mildly degraded by motion artifact despite repeated imaging attempts. 15 mm area of confluent restricted diffusion in the left paracentral pons (series 5, image 14) with T2 hyperintensity but no associated hemorrhage or mass effect. No other restricted diffusion identified. Major intracranial vascular flow voids are preserved, with vertebrobasilar dolichoectasia. Confluent bilateral cerebral white matter T2 and FLAIR hyperintensity. T2 heterogeneity throughout the deep gray matter nuclei which appears in part related to perivascular spaces. Chronic lacunar infarct in the left thalamus. No chronic cerebral blood products. Other brain structures appear within normal limits for age. No midline shift, mass effect, evidence of mass lesion, ventriculomegaly, extra-axial collection or acute intracranial hemorrhage. Cervicomedullary junction and pituitary are within normal limits. Negative visualized cervical spine. Normal bone marrow signal. Stable and well pneumatized visualized paranasal sinuses and mastoids. Visible internal auditory structures appear normal. No acute orbit or scalp soft tissue findings. IMPRESSION: 1. Acute infarct in the left pons. No associated hemorrhage or mass effect. 2. No other acute intracranial abnormality. Chronic signal changes in the cerebral white matter and deep gray matter most compatible with chronic small vessel disease. Electronically Signed   By: Odessa Fleming M.D.   On: 07/14/2015 14:10   Dg Swallowing Func-speech Pathology  07/14/2015  Objective Swallowing Evaluation: Type of Study: MBS-Modified Barium Swallow Study Patient Details Name: DARIAN ACE MRN: [id number] Date of Birth: 1923-12-26 Today's Date: 07/14/2015 Time: SLP Start Time (ACUTE ONLY): 0745-SLP Stop Time (ACUTE ONLY): 0820 SLP  Time Calculation (min) (ACUTE ONLY): 35 min Past Medical History: Past Medical History Diagnosis Date . Closed right hip fracture [facility])    Just recently had this occur and surgical repair . Dementia  . HTN (hypertension)  . A-fib (HCC)  . GERD (gastroesophageal reflux disease)  . SDH (subdural hematoma) [facility]) April   Resolved on most recent  CT Past Surgical History: Past Surgical History Procedure Laterality Date . Abdominal hysterectomy   . Hip surgery     Prior L hip surgery, recent R hip surgery for fracture HPI: Patient is a 82 y/o female with hx of dementia, HTN, A-fib, GERD, SDH and recent hip fx presents from SNF with slurred speech, R sided upper, weakness and facial droop. MRI still pending. MBS indicated due to significant dysphagia noted at bedside.   Subjective: patient laying in bed confused but present Assessment / Plan / Recommendation CHL IP CLINICAL IMPRESSIONS 07/14/2015 Therapy Diagnosis Moderate oral phase dysphagia;Moderate pharyngeal phase dysphagia Clinical Impression Moderate oropharyngeal dysphagia with sensorimotor deficits.  Decreased oral coordination resulted in premature spillage of boluses into pharynx.  Pharyngeal swallow was delayed to vallecular space - spiling over epiglottis - resulting in aspiration BEFORE the swallow with nectar straw/cup and thin.  Aspiration initially was audible but cough was ineffective to clear.  Later in study, pt with silent aspiration of even secretions.  Pt did not aspirate honey, nectar via tsp nor solids including puree/cereal bar.   Wet voice noted at end of study = but cleared with cued throat clear and swallow.  Recommend very strict aspiration precautions and absolute supervision. Due to pt's cognitive deficits and oral deficits, SLP did not test chin tuck compensation strategies.  Impact on safety and function Risk for inadequate nutrition/hydration;Moderate aspiration risk   CHL IP TREATMENT RECOMMENDATION 07/14/2015 Treatment Recommendations  Therapy as outlined in treatment plan below   Prognosis 07/14/2015 Prognosis for Safe Diet Advancement Fair Barriers to Reach Goals Severity of deficits;Cognitive deficits Barriers/Prognosis Comment -- CHL IP DIET RECOMMENDATION 07/14/2015 SLP Diet Recommendations Dysphagia 1 (Puree) solids;Nectar thick liquid Liquid Administration via Spoon Medication Administration Crushed with puree Compensations Minimize environmental distractions;Slow rate;Small sips/bites;Other (Comment) Postural Changes Seated upright at 90 degrees;Remain semi-upright after after feeds/meals (Comment)   CHL IP OTHER RECOMMENDATIONS 07/14/2015 Recommended Consults -- Oral Care Recommendations Oral care before and after PO Other Recommendations Have oral suction available   CHL IP FOLLOW UP RECOMMENDATIONS 07/14/2015 Follow up Recommendations Skilled Nursing facility   [facility] IP FREQUENCY AND DURATION 07/14/2015 Speech Therapy Frequency (ACUTE ONLY) min 2x/week Treatment Duration 2 weeks      CHL IP ORAL PHASE 07/14/2015 Oral Phase Impaired Oral - Pudding Teaspoon -- Oral - Pudding Cup -- Oral - Honey Teaspoon Decreased bolus cohesion;Delayed oral transit;Premature spillage;Weak lingual manipulation;Reduced posterior propulsion Oral - Honey Cup -- Oral - Nectar Teaspoon Premature spillage;Decreased bolus cohesion;Delayed oral transit;Weak lingual manipulation;Reduced posterior propulsion Oral - Nectar Cup Weak lingual manipulation;Reduced posterior propulsion Oral - Nectar Straw Weak lingual manipulation;Reduced posterior propulsion Oral - Thin Teaspoon Weak lingual manipulation;Premature spillage;Decreased bolus cohesion;Delayed oral transit;Reduced posterior propulsion Oral - Thin Cup -- Oral - Thin Straw -- Oral - Puree Weak lingual manipulation;Premature spillage;Decreased bolus cohesion;Delayed oral transit;Reduced posterior propulsion Oral - Mech Soft Weak lingual manipulation;Premature spillage;Decreased bolus cohesion;Delayed oral  transit;Reduced posterior propulsion Oral - Regular -- Oral - Multi-Consistency -- Oral - Pill -- Oral Phase - Comment delay oral transiting with decreased oral coordination resulting in premature spillage of boluses into pharynx uncontrolled  CHL IP PHARYNGEAL PHASE 07/14/2015 Pharyngeal Phase Impaired Pharyngeal- Pudding Teaspoon -- Pharyngeal -- Pharyngeal- Pudding Cup -- Pharyngeal -- Pharyngeal- Honey Teaspoon Delayed swallow initiation-vallecula Pharyngeal -- Pharyngeal- Honey Cup -- Pharyngeal -- Pharyngeal- Nectar Teaspoon Delayed swallow initiation-vallecula Pharyngeal -- Pharyngeal- Nectar Cup Delayed swallow initiation-vallecula;Penetration/Aspiration before swallow Pharyngeal Material enters airway, passes BELOW cords without attempt by patient to eject  out (silent aspiration) Pharyngeal- Nectar Straw Delayed swallow initiation-vallecula;Penetration/Aspiration before swallow;Penetration/Apiration after swallow Pharyngeal Material enters airway, passes BELOW cords without attempt by patient to eject out (silent aspiration);Material enters airway, passes BELOW cords and not ejected out despite cough attempt by patient Pharyngeal- Thin Teaspoon Delayed swallow initiation-vallecula Pharyngeal -- Pharyngeal- Thin Cup -- Pharyngeal -- Pharyngeal- Thin Straw -- Pharyngeal -- Pharyngeal- Puree Delayed swallow initiation-vallecula Pharyngeal -- Pharyngeal- Mechanical Soft Delayed swallow initiation-vallecula Pharyngeal -- Pharyngeal- Regular -- Pharyngeal -- Pharyngeal- Multi-consistency -- Pharyngeal -- Pharyngeal- Pill -- Pharyngeal -- Pharyngeal Comment initially pt with audible aspiration but after airway infiltrated  - pt with silent aspiration; cued coughing did not clear aspirates  CHL IP CERVICAL ESOPHAGEAL PHASE 07/14/2015 Cervical Esophageal Phase Impaired Pudding Teaspoon -- Pudding Cup -- Honey Teaspoon -- Honey Cup -- Nectar Teaspoon -- Nectar Cup -- Nectar Straw -- Thin Teaspoon -- Thin Cup -- Thin  Straw -- Puree -- Mechanical Soft -- Regular -- Multi-consistency -- Pill -- Cervical Esophageal Comment appearance of mild decresed clearance distally, radiologist not present to confirm CHL IP GO 07/13/2015 Functional Assessment Tool Used skilled clinical judgement  Functional Limitations Swallowing Swallow Current Status (Z6109) CM Swallow Goal Status (U0454) CJ Swallow Discharge Status (U9811) (None) Motor Speech Current Status (B1478) (None) Motor Speech Goal Status (G9562) (None) Motor Speech Goal Status (Z3086) (None) Spoken Language Comprehension Current Status (V7846) (None) Spoken Language Comprehension Goal Status (N6295) (None) Spoken Language Comprehension Discharge Status (M8413) (None) Spoken Language Expression Current Status (K4401) (None) Spoken Language Expression Goal Status (U2725) (None) Spoken Language Expression Discharge Status (D6644) (None) Attention Current Status (I3474) (None) Attention Goal Status (Q5956) (None) Attention Discharge Status (L8756) (None) Memory Current Status (E3329) (None) Memory Goal Status (J1884) (None) Memory Discharge Status (Z6606) (None) Voice Current Status (T0160) (None) Voice Goal Status (F0932) (None) Voice Discharge Status (T5573) (None) Other Speech-Language Pathology Functional Limitation (U2025) (None) Other Speech-Language Pathology Functional Limitation Goal Status (K2706) (None) Other Speech-Language Pathology Functional Limitation Discharge Status [phone]) (None) Donavan Burnet, MS [facility] SLP [phone]               Microbiology: Recent Results (from the past 240 hour(s))  MRSA PCR Screening     Status: None   Collection Time: 07/13/15  5:22 AM  Result Value Ref Range Status   MRSA by PCR NEGATIVE NEGATIVE Final    Comment:        The GeneXpert MRSA Assay (FDA approved for NASAL specimens only), is one component of a comprehensive MRSA colonization surveillance program. It is not intended to diagnose MRSA infection nor to guide or monitor  treatment for MRSA infections.      Labs: Basic Metabolic Panel:  Recent Labs Lab 07/13/15 0658 07/14/15 0628 07/15/15 0537  NA 135 137 139  K 3.4* 3.3* 3.1*  CL 101 103 104  CO2 GLUCOSE 99 109* 121*  BUN CREATININE 0.89 0.89 0.87  CALCIUM 10.2 9.6 9.8   CBC:  Recent Labs Lab 07/13/15 0658 07/14/15 0628 07/15/15 0800  WBC 8.7 9.9 10.8*  HGB 11.4* 11.5* 11.3*  HCT 35.5* 35.3* 34.7*  MCV 88.5 88.5 87.4  PLT 267 253 237   Signed:  Vassie Loll MD.  [facility] 07/15/2015, 2:35 PM

## 2015-07-15 NOTE — Progress Notes (Signed)
Speech Language Pathology Treatment: Dysphagia  Patient Details Name: Emily Franklin MRN: [id number] DOB: 02-27-1923 Today's Date: 07/15/2015 Time: 0710-0730 SLP Time Calculation (min) (ACUTE ONLY): 20 min  Assessment / Plan / Recommendation Clinical Impression  Pt seen to assess po tolerance - note fever spikes overnight.  Pt's voice is mildly weak requiring maximum cues to increased vocal intensity.   Oral suction provided prior to and after po administration.    SLP addressed dysphagia only due to pt temperature spikes.  Liquids via tsp provided to pt including nectar and honey.  Pt with subtle throat clearing post-swallow of nectar concerning for laryngeal penetration.  Honey tsp with chin tuck posture tolerated without overt deficits but pt requires total cues to tuck chin.   Given pt with delay in pharyngeal swallow response but very strong swallow - SLP will modify diet to honey via tsp to maximize airway protection.    Phoned RN to make her aware of changes and need for absolute full supervision with chin tuck posture.    Will follow up next date to assess po tolerance and for SLE.  Of note, pt able to verbalize choice between two juice options and stated "I'm better".  Speech beyond phrase level is unintelligible.      HPI HPI: Patient is a 82 y/o female with hx of dementia, HTN, A-fib, GERD, SDH and recent hip fx presents from SNF with slurred speech, R sided upper, weakness and facial droop. MRI still pending. MBS indicated due to significant dysphagia noted at bedside.        SLP Plan  Continue with current plan of care     Recommendations  Diet recommendations: Dysphagia 1 (puree);Honey-thick liquid Liquids provided via: Teaspoon Medication Administration: Whole meds with puree Supervision: Full supervision/cueing for compensatory strategies Compensations: Small sips/bites;Slow rate;Chin tuck Postural Changes and/or Swallow Maneuvers: Seated upright 90 degrees;Upright 30-60  min after meal             Oral Care Recommendations: Oral care before and after PO Follow up Recommendations: 24 hour supervision/assistance;Skilled Nursing facility Plan: Continue with current plan of care     GO                Donavan Burnetamara Deano Tomaszewski, MS [facility] SLP [phone]

## 2015-09-25 DEATH — deceased

## 2018-02-14 IMAGING — MR MR HEAD W/O CM
10 of 12 series · 36 of 48 positions shown · non-contrast
Comparison: CTA head and neck 07/13/2015 and earlier.

CLINICAL DATA: 89-year-old female with right side severe weakness
and aphasia. Initial encounter.

EXAM:
MRI HEAD WITHOUT CONTRAST
TECHNIQUE: Multiplanar, multiecho pulse sequences of the brain and surrounding
structures were obtained without intravenous contrast.

[Series 3: FLAIR · sagittal · 5.0mm · 0.47mm/px · 1 of 23 slices shown (1 of 2)]
[im 1/23]
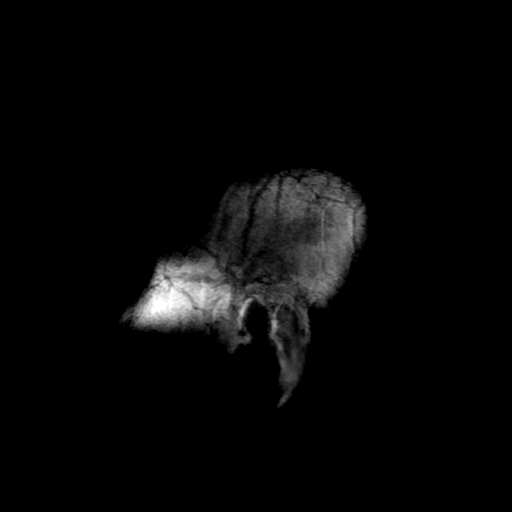

[Series 5: DWI · axial · 3.0mm · 0.94mm/px · z∈[-97,+49]mm · 7 of 100 slices shown (1 of 2)]
[im 1/100]
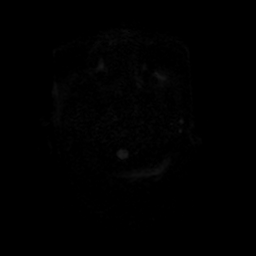
[im 17/100]
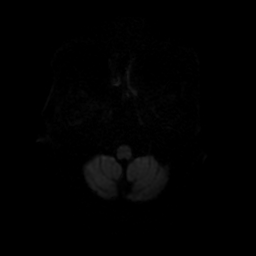
[im 34/100]
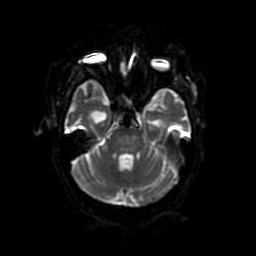
[im 50/100]
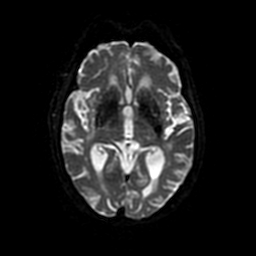
[im 67/100]
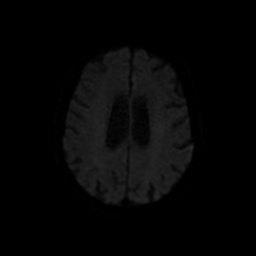
[im 83/100]
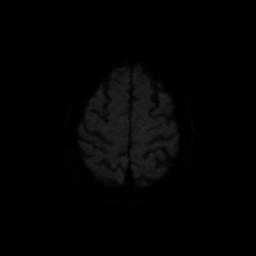
[im 100/100]
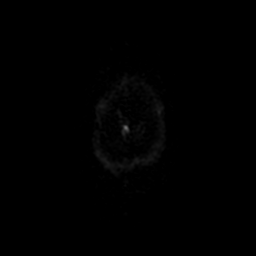

[Series 6: T2 · axial · 5.0mm · 0.47mm/px · z∈[-95,+48]mm · 2 of 25 slices shown (1 of 2)]
[im 1/25]
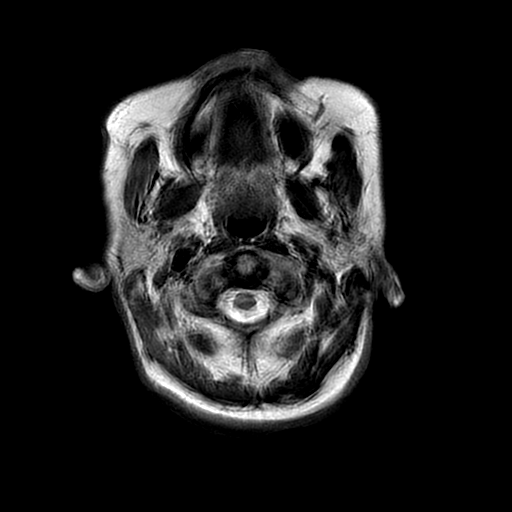
[im 25/25]
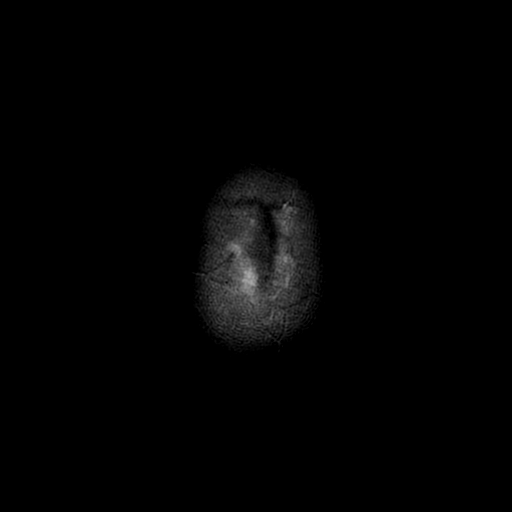

[Series 7: FLAIR · axial · 5.0mm · 0.94mm/px · z∈[-95,+48]mm · 2 of 25 slices shown (2 of 2)]
[im 1/25]
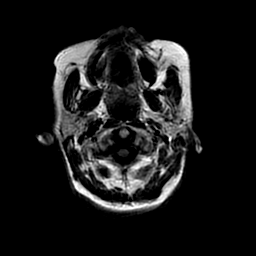
[im 25/25]
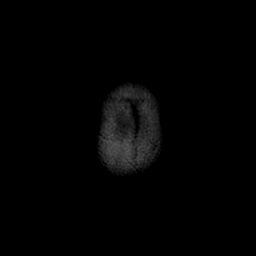

[Series 8: DWI · coronal · 4.0mm · 0.94mm/px · 5 of 71 slices shown (2 of 2)]
[im 1/71]
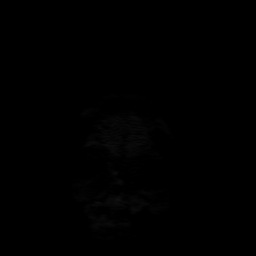
[im 18/71]
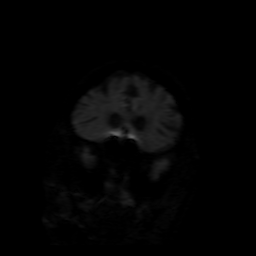
[im 36/71]
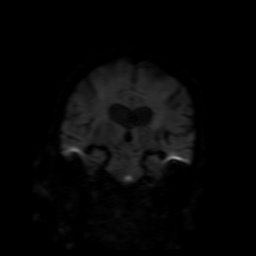
[im 53/71]
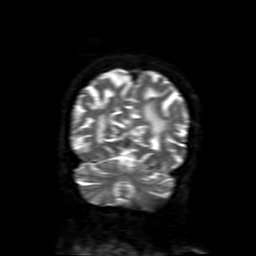
[im 71/71]
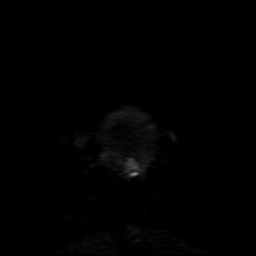

[Series 9: (person_name) · axial · 3.0mm · 0.47mm/px · z∈[-97,+25]mm · 6 of 100 slices shown]
[im 1/100]
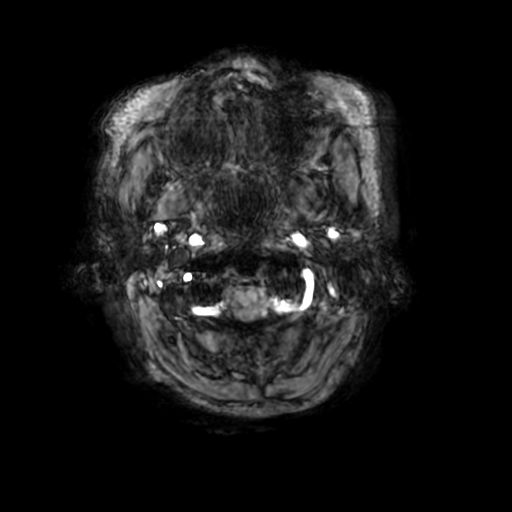
[im 17/100]
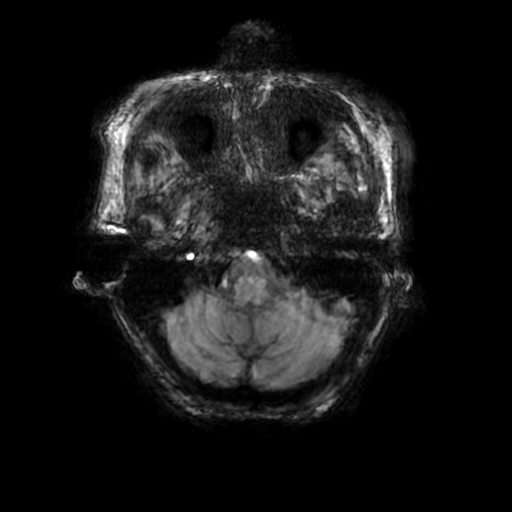
[im 34/100]
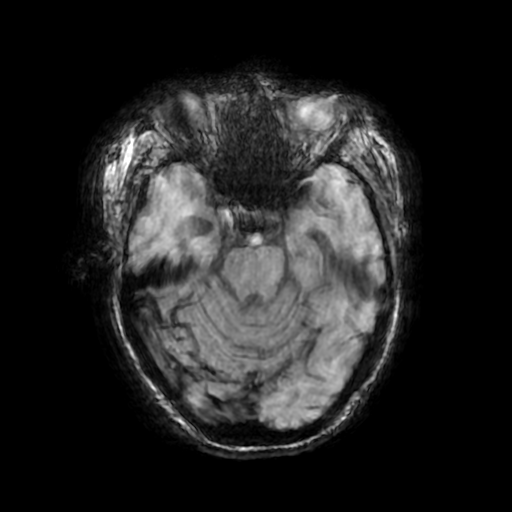
[im 50/100]
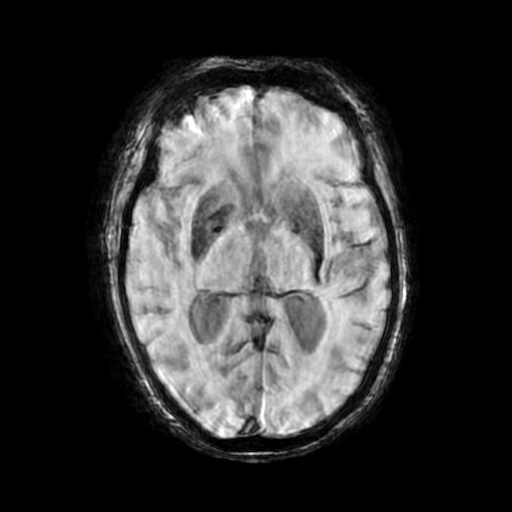
[im 67/100]
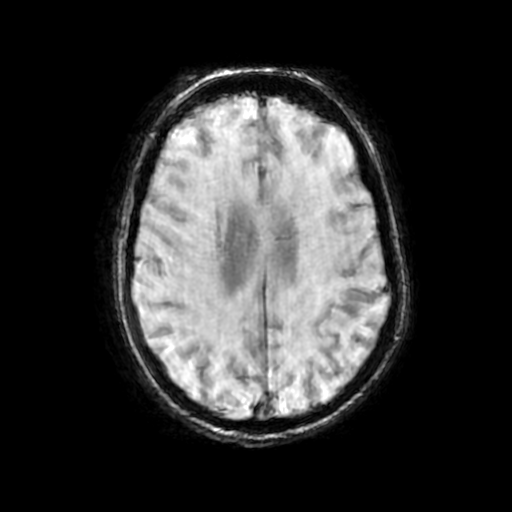
[im 83/100]
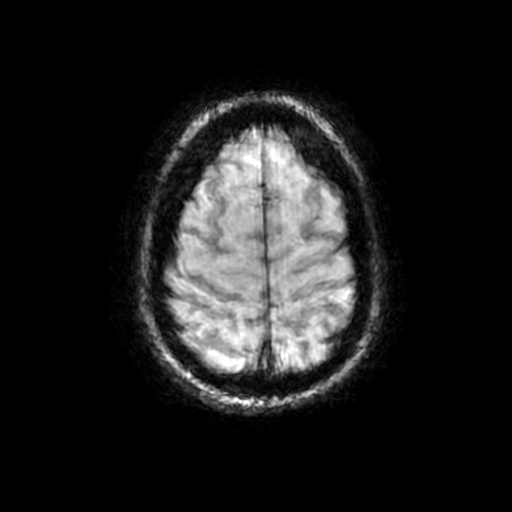

[Series 11: T2 · coronal · 5.0mm · 0.47mm/px · 2 of 30 slices shown (2 of 2)]
[im 1/30]
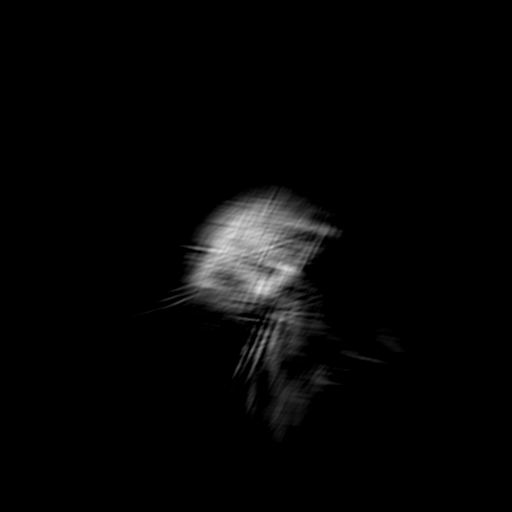
[im 30/30]
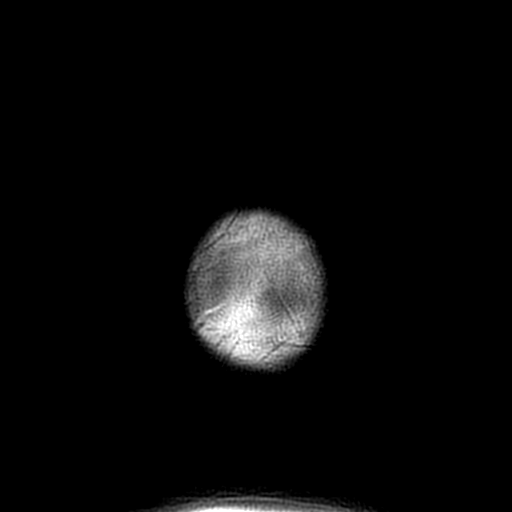

[Series 550: ADC · axial · 3.0mm · 0.94mm/px · z∈[-97,+49]mm · 4 of 50 slices shown (1 of 3)]
[im 1/50]
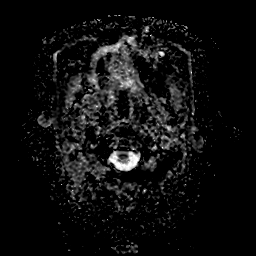
[im 17/50]
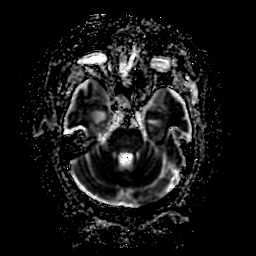
[im 33/50]
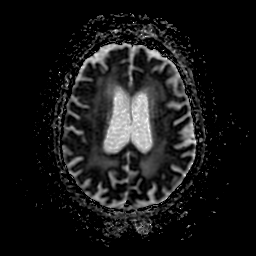
[im 50/50]
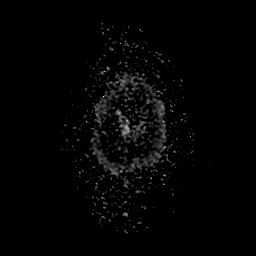

[Series 551: ADC · axial · 3.0mm · 0.94mm/px · z∈[-97,+49]mm · 4 of 50 slices shown (2 of 3)]
[im 1/50]
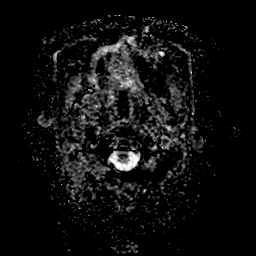
[im 17/50]
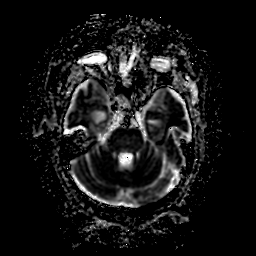
[im 33/50]
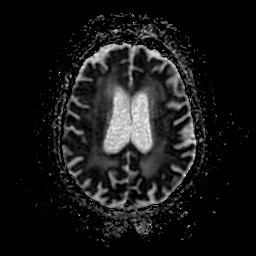
[im 50/50]
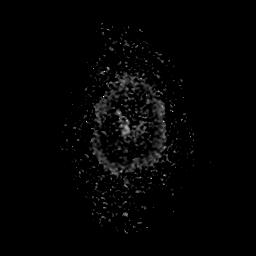

[Series 850: ADC · coronal · 4.0mm · 0.94mm/px · 3 of 36 slices shown (3 of 3)]
[im 1/36]
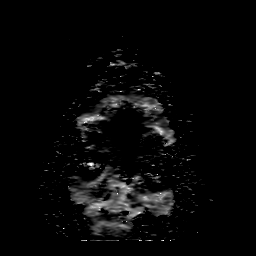
[im 18/36]
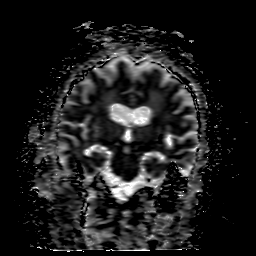
[im 36/36]
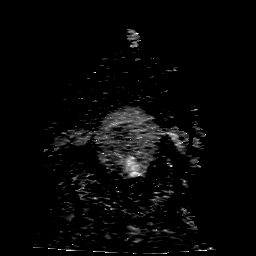

[36 of 48 positions shown; findings below may reference images not displayed]

FINDINGS: Study is mildly degraded by motion artifact despite repeated imaging
attempts.

15 mm area of confluent restricted diffusion in the left paracentral
pons (series 5, image 14) with T2 hyperintensity but no associated
hemorrhage or mass effect.

No other restricted diffusion identified. Major intracranial
vascular flow voids are preserved, with vertebrobasilar
dolichoectasia.

Confluent bilateral cerebral white matter T2 and FLAIR
hyperintensity. T2 heterogeneity throughout the deep gray matter
nuclei which appears in part related to perivascular spaces. Chronic
lacunar infarct in the left thalamus. No chronic cerebral blood
products. Other brain structures appear within normal limits for
age. No midline shift, mass effect, evidence of mass lesion,
ventriculomegaly, extra-axial collection or acute intracranial
hemorrhage. Cervicomedullary junction and pituitary are within
normal limits. Negative visualized cervical spine.

Normal bone marrow signal. Stable and well pneumatized visualized
paranasal sinuses and mastoids. Visible internal auditory structures
appear normal. No acute orbit or scalp soft tissue findings.
IMPRESSION: 1. Acute infarct in the left pons. No associated hemorrhage or mass
effect.
2. No other acute intracranial abnormality. Chronic signal changes
in the cerebral white matter and deep gray matter most compatible
with chronic small vessel disease.
